# Patient Record
Sex: Male | Born: 1960 | Race: White | Hispanic: No | Marital: Married | Smoking: Former smoker
Health system: Southern US, Community
[De-identification: ages and names within clinical notes are randomized; demographics above are authoritative.]

## PROBLEM LIST (undated history)

## (undated) DIAGNOSIS — K219 Gastro-esophageal reflux disease without esophagitis: Secondary | ICD-10-CM

## (undated) DIAGNOSIS — I219 Acute myocardial infarction, unspecified: Secondary | ICD-10-CM

## (undated) DIAGNOSIS — I4891 Unspecified atrial fibrillation: Secondary | ICD-10-CM

## (undated) DIAGNOSIS — I209 Angina pectoris, unspecified: Secondary | ICD-10-CM

## (undated) DIAGNOSIS — I2510 Atherosclerotic heart disease of native coronary artery without angina pectoris: Secondary | ICD-10-CM

## (undated) DIAGNOSIS — I10 Essential (primary) hypertension: Secondary | ICD-10-CM

## (undated) HISTORY — PX: CORONARY ANGIOPLASTY: SHX604

---

## 2003-12-06 ENCOUNTER — Other Ambulatory Visit: Payer: Self-pay

## 2004-07-07 ENCOUNTER — Other Ambulatory Visit: Payer: Self-pay

## 2005-11-28 ENCOUNTER — Other Ambulatory Visit: Payer: Self-pay

## 2005-11-28 ENCOUNTER — Emergency Department: Payer: Self-pay | Admitting: Internal Medicine

## 2006-02-06 ENCOUNTER — Ambulatory Visit: Payer: Self-pay | Admitting: Gastroenterology

## 2006-03-28 ENCOUNTER — Other Ambulatory Visit: Payer: Self-pay

## 2006-03-29 ENCOUNTER — Observation Stay: Payer: Self-pay | Admitting: Internal Medicine

## 2006-09-22 ENCOUNTER — Inpatient Hospital Stay: Payer: Self-pay | Admitting: Internal Medicine

## 2006-09-22 ENCOUNTER — Other Ambulatory Visit: Payer: Self-pay

## 2006-09-23 ENCOUNTER — Other Ambulatory Visit: Payer: Self-pay

## 2007-01-16 ENCOUNTER — Inpatient Hospital Stay: Payer: Self-pay | Admitting: Internal Medicine

## 2007-01-16 ENCOUNTER — Other Ambulatory Visit: Payer: Self-pay

## 2007-01-17 ENCOUNTER — Other Ambulatory Visit: Payer: Self-pay

## 2007-01-18 ENCOUNTER — Other Ambulatory Visit: Payer: Self-pay

## 2007-01-19 ENCOUNTER — Other Ambulatory Visit: Payer: Self-pay

## 2007-02-03 ENCOUNTER — Encounter: Payer: Self-pay | Admitting: Internal Medicine

## 2007-02-13 ENCOUNTER — Encounter: Payer: Self-pay | Admitting: Internal Medicine

## 2007-03-16 ENCOUNTER — Encounter: Payer: Self-pay | Admitting: Internal Medicine

## 2007-04-15 ENCOUNTER — Encounter: Payer: Self-pay | Admitting: Internal Medicine

## 2007-05-16 ENCOUNTER — Encounter: Payer: Self-pay | Admitting: Internal Medicine

## 2007-06-15 ENCOUNTER — Encounter: Payer: Self-pay | Admitting: Internal Medicine

## 2011-08-27 ENCOUNTER — Ambulatory Visit: Payer: Self-pay

## 2014-07-01 ENCOUNTER — Inpatient Hospital Stay: Payer: Self-pay | Admitting: Internal Medicine

## 2014-07-01 LAB — BASIC METABOLIC PANEL
Anion Gap: 10 (ref 7–16)
BUN: 10 mg/dL (ref 7–18)
CHLORIDE: 106 mmol/L (ref 98–107)
CREATININE: 1.1 mg/dL (ref 0.60–1.30)
Calcium, Total: 8.5 mg/dL (ref 8.5–10.1)
Co2: 24 mmol/L (ref 21–32)
EGFR (African American): >60
EGFR (Non-African Amer.): >60
Glucose: 112 mg/dL — ABNORMAL HIGH (ref 65–99)
Osmolality: 279 (ref 275–301)
POTASSIUM: 4.2 mmol/L (ref 3.5–5.1)
Sodium: 140 mmol/L (ref 136–145)

## 2014-07-01 LAB — HEPARIN LEVEL (UNFRACTIONATED): Anti-Xa(Unfractionated): <0.1 IU/mL — ABNORMAL LOW (ref 0.30–0.70)

## 2014-07-01 LAB — CBC
HCT: 47.7 % (ref 40.0–52.0)
HGB: 16 g/dL (ref 13.0–18.0)
MCH: 31.9 pg (ref 26.0–34.0)
MCHC: 33.5 g/dL (ref 32.0–36.0)
MCV: 95 fL (ref 80–100)
Platelet: 247 10*3/uL (ref 150–440)
RBC: 5.01 10*6/uL (ref 4.40–5.90)
RDW: 13.9 % (ref 11.5–14.5)
WBC: 6.2 10*3/uL (ref 3.8–10.6)

## 2014-07-01 LAB — TROPONIN I
Troponin-I: <0.02 ng/mL
Troponin-I: <0.02 ng/mL

## 2014-07-01 LAB — PROTIME-INR
INR: 1
PROTHROMBIN TIME: 12.8 s (ref 11.5–14.7)

## 2014-07-01 LAB — APTT: Activated PTT: 31.9 secs (ref 23.6–35.9)

## 2014-07-02 LAB — CBC WITH DIFFERENTIAL/PLATELET
Basophil #: 0.1 10*3/uL (ref 0.0–0.1)
Basophil %: 1.3 %
EOS ABS: 0.5 10*3/uL (ref 0.0–0.7)
Eosinophil %: 6 %
HCT: 48.1 % (ref 40.0–52.0)
HGB: 16.4 g/dL (ref 13.0–18.0)
LYMPHS ABS: 2.2 10*3/uL (ref 1.0–3.6)
LYMPHS PCT: 24.5 %
MCH: 32.7 pg (ref 26.0–34.0)
MCHC: 34 g/dL (ref 32.0–36.0)
MCV: 96 fL (ref 80–100)
MONOS PCT: 8.4 %
Monocyte #: 0.7 x10 3/mm (ref 0.2–1.0)
NEUTROS PCT: 59.8 %
Neutrophil #: 5.3 10*3/uL (ref 1.4–6.5)
Platelet: 254 10*3/uL (ref 150–440)
RBC: 5.01 10*6/uL (ref 4.40–5.90)
RDW: 13.9 % (ref 11.5–14.5)
WBC: 8.8 10*3/uL (ref 3.8–10.6)

## 2015-01-30 ENCOUNTER — Inpatient Hospital Stay: Payer: Self-pay | Admitting: Internal Medicine

## 2015-04-07 NOTE — H&P (Signed)
PATIENT NAME:  Joseph Grant, Joseph Grant MR#:  [id number] DATE OF BIRTH:  [birth year]  DATE OF ADMISSION:  07/01/2014  PRIMARY CARE PHYSICIAN: Does not have one.   CHIEF COMPLAINT: Palpitations.   HISTORY OF PRESENT ILLNESS: This is a 54 year old male who presents to the hospital with intermittent palpitations now ongoing for the past few days. The patient has a history of paroxysmal atrial fibrillation, but has not seen a physician and lost followup in the past few years. He presented to the hospital and was noted to be in uncontrolled atrial fibrillation with heart rates in the 130s to 140s. The patient was given pulse doses of Cardizem, but despite that, his heart rate still remained uncontrolled. The patient is presently being started on a Cardizem drip. Hospitalist services were contacted for further treatment and evaluation. The patient denies any chest pain. He admits to some exertional shortness of breath, but no nausea, no vomiting or diaphoresis. No other associated symptoms presently.   REVIEW OF SYSTEMS:  CONSTITUTIONAL: No documented fever. No weight gain, no weight loss.  EYES: No blurry or double vision.  ENT: No tinnitus. No postnasal drip. No redness of the oropharynx.  RESPIRATORY: No cough, no wheeze, no hemoptysis, no dyspnea.  CARDIOVASCULAR: No chest pain. No orthopnea. Positive palpitations. No syncope.  GASTROINTESTINAL: No nausea. No vomiting. No diarrhea. No abdominal pain. No melena or hematochezia.  GENITOURINARY: No dysuria, no hematuria.  ENDOCRINE: No polyuria or nocturia, heat or cold intolerance.  HEMATOLOGIC: No anemia, no bruising, no bleeding.  INTEGUMENTARY: No rashes. No lesions.  MUSCULOSKELETAL: No arthritis, no swelling, no gout.  NEUROLOGIC: No numbness or tingling. No ataxia. No seizure-type activity. PSYCHIATRIC: No anxiety. No insomnia. No ADD.   PAST MEDICAL HISTORY: Consistent with history of hypertension, paroxysmal atrial fibrillation, coronary artery  disease.   ALLERGIES: PENICILLIN AND ZITHROMAX, BOTH OF WHICH CAUSE RASH.   SOCIAL HISTORY: Does smoke about 1/2-pack per day, has been smoking for the past 25 to 30 years. Also drinks 2 beers daily. No illicit drug abuse. Lives at home with his wife and kid.   FAMILY HISTORY: Mother and father are both alive. Father has prostate cancer. Mother also has atrial fibrillation.   CURRENT MEDICATIONS: The patient is on aspirin 81 mg daily.   PHYSICAL EXAMINATION: Presently, is as follows:  VITAL SIGNS: Noted to be: Temperature is 98.2, pulse 101, respirations 14, blood pressure 140/81, saturation is 95% on room air.  GENERAL: He is a pleasant-appearing male in no apparent distress.  HEAD, EYES, EARS, NOSE AND THROAT: Atraumatic, normocephalic. Extraocular muscles are intact. Pupils equal and reactive to light. Sclerae anicteric. No conjunctival injection. No pharyngeal erythema.  NECK: Supple. There is no jugular venous distention. No bruits, no lymphadenopathy, no thyromegaly.  HEART: Irregularly irregular. No murmurs, no rubs, no clicks.  LUNGS: He has prolonged inspiratory and expiratory phase. Negative use of accessory muscles. No dullness to percussion. No rales. No rhonchi, no wheezes.  ABDOMEN: Soft, flat, nontender, nondistended. Has good bowel sounds. No hepatosplenomegaly appreciated.  EXTREMITIES: No evidence of any cyanosis, clubbing or peripheral edema. Has +2 pedal and radial pulses bilaterally.  NEUROLOGIC: The patient is alert, awake and oriented x3 with no focal motor or sensory deficits appreciated bilaterally.  SKIN: Moist and warm with no rashes appreciated.  LYMPHATIC: There is no cervical or axillary lymphadenopathy.   LABORATORY DATA: Shows serum glucose of 112, BUN 10, creatinine 1.1, sodium 140, potassium 4.2, chloride 106, bicarbonate 24. Troponin less than 0.02. White  cell count 6.2, hemoglobin 16, hematocrit 47.7, platelet count 247.   The patient did have an EKG done  which shows uncontrolled atrial fibrillation with RVR with heart rates in the 130s to 140s.   ASSESSMENT AND PLAN: This is a 54 year old male with a history of hypertension, paroxysmal atrial fibrillation, coronary artery disease, who presents to the hospital with uncontrolled atrial fibrillation and palpitations.   1. Atrial fibrillation with rapid ventricular response. The patient has a history of paroxysmal atrial fibrillation, but has lost followup and has been asymptomatic for many years. His rate is presently significantly uncontrolled. Will start him on a Cardizem drip. Also place him on oral Cardizem and attempt to wean him off the drip. Will start him on a heparin nomogram for anticoagulation for now. His CHADS score is just 1. Will get a 2-dimensional echocardiogram. Will also get a cardiology consult. The case was discussed with Dr. Ubaldo Glassing, who will see the patient later.  2. History of coronary artery disease. The patient had no acute chest pain. I will cycle his cardiac markers, get a 2-dimensional echocardiogram.  3. Hypertension. The patient is presently hemodynamically stable. Continue his Cardizem for now.   CODE STATUS: The patient is a full code.   TIME SPENT ON ADMISSION: 50 minutes.   ____________________________ Belia Heman. Verdell Carmine, MD vjs:lb D: 07/01/2014 11:09:38 ET T: 07/01/2014 11:43:04 ET JOB#: [id number]  cc: Belia Heman. Verdell Carmine, MD, <Dictator> Henreitta Leber MD ELECTRONICALLY SIGNED 07/13/2014 14:02

## 2015-04-07 NOTE — Discharge Summary (Signed)
PATIENT NAMEKIONTE, Joseph Grant MR#:  [id number] DATE OF BIRTH:  [birth year]  DATE OF ADMISSION:  07/01/2014 DATE OF DISCHARGE:  07/02/2014  PRESENTING COMPLAINT: Palpitations.   DISCHARGE DIAGNOSES: 1.  Rapid atrial fibrillation with rapid ventricular response, now stable.  2.  Hypertension.  3.  Coronary artery disease with stent in the past.   CODE STATUS: FULL.  DISCHARGE MEDICATIONS: 1.  Eliquis 5 mg b.i.d.  2.  Cardizem CD 180 mg p.o. daily.  3.  Omeprazole 20 mg daily.   DISCHARGE INSTRUCTIONS: Follow up with Dr. Clayborn Bigness in 1 to 2 weeks.   Follow up with Dr. Randel Books in 2 weeks.   CONSULTANTS: Cardiology with Dr. Ubaldo Glassing.   DIAGNOSTIC DATA: Echo Doppler showed EF of 60% to 65%, mildly dilated left atrium, moderately increased left ventricular posterior wall thickness.   CBC within normal limits. Troponin x3 negative. Basic metabolic panel within normal limits.   BRIEF SUMMARY OF HOSPITAL COURSE: Joseph Grant is a 54 year old Caucasian gentleman with history of A-fib, paroxysmal, along with CAD in the past, who comes in with uncontrolled A-fib and palpitations. He was admitted with:  1.  A-fib with RVR. The patient has history of paroxysmal A-fib, lost to followup and has been asymptomatic. However, now started having palpitations. He received IV Cardizem, several doses, and now stable on p.o. Cardizem. Echo showed EF of 60% to 65%. Dr. Ubaldo Glassing recommends the patient be on anticoagulation with p.o. Eliquis. The patient remained hemodynamically stable.  2.  CAD status post stent in the past. Cardiac enzymes x2 were negative.  3.  Hypertension. Continue Cardizem.  4.  Smoking cessation advised.   Hospital stay otherwise remained stable. The patient remained a FULL code.   TIME SPENT: 40 minutes.   ____________________________ Hart Rochester Posey Pronto, MD sap:sb D: 07/04/2014 10:39:34 ET T: 07/04/2014 11:46:30 ET JOB#: [id number]  cc: Julie-Anne Torain A. Posey Pronto, MD, <Dictator> Dwayne D. Clayborn Bigness, MD Javier Docker Ubaldo Glassing, MD Milinda Pointer Jacqualine Code, MD  Ilda Basset MD ELECTRONICALLY SIGNED 07/11/2014 15:44

## 2015-04-07 NOTE — Consult Note (Signed)
   Present Illness 54 yo male with history of paroxysmal afib with the last episode occuring 8-10 years ago who was admitted with rapid herat rate. Noted to be in afib with rvr. Placed on iv and po cardizem and rate has improved. No evidence of chf or acute mi. Denies illicit drug use. Denies chest pain . CHADSS score is 1. No bleeding history.   Physical Exam:  GEN no acute distress   HEENT PERRL, moist oral mucosa   NECK supple   RESP clear BS   CARD Irregular rate and rhythm  No murmur   ABD denies tenderness  normal BS  hypoactive BS   LYMPH negative neck, negative axillae   EXTR negative cyanosis/clubbing, negative edema   SKIN normal to palpation   NEURO cranial nerves intact, motor/sensory function intact   PSYCH A+O to time, place, person   Review of Systems:  Subjective/Chief Complaint irregular heart rate   General: No Complaints   Skin: No Complaints   ENT: No Complaints   Eyes: No Complaints   Neck: No Complaints   Respiratory: No Complaints   Cardiovascular: Palpitations   Gastrointestinal: No Complaints   Genitourinary: No Complaints   Vascular: No Complaints   Musculoskeletal: No Complaints   Neurologic: No Complaints   Hematologic: No Complaints   Endocrine: No Complaints   Psychiatric: No Complaints   Review of Systems: All other systems were reviewed and found to be negative   Medications/Allergies Reviewed Medications/Allergies reviewed   Family & Social History:  Family and Social History:  Family History Non-Contributory   Social History negative tobacco, negative ETOH   Place of Living Home   EKG:  Interpretation afib with variable ventricular response.    Penicillin: Swelling, Rash  Zithromax: Swelling   Impression 54 yo male with history of afib in the past admitted with rapid heart rate and noted to be in recurrent afib with rvr. Rate improved with iv and po cardizem. Discussed risk and benefits of chornic  anticoagulation with patient and he reports understanding. Will discontinue heparin and place on Eliquis 5 mg bid. Will review echo when available. Ambulate and consider discharge on Eliquis 5 bid and cardizem cd 180 daily. Will consider cardiversion after 4 weeks of chornic anticoagulation as outpatient.   Plan 1. Continue with Eliquis 5 bid 2. Cardizem cd 180 daily 3 Review echo when avialable 4. Amublate and discharge if rate well controlled for consdieration for outpaitient cardioversion in 4 weeks.   Electronic Signatures: Teodoro Spray (MD)  (Signed 19-Jul-15 07:57)  Authored: General Aspect/Present Illness, History and Physical Exam, Review of System, Family & Social History, Home Medications, EKG , Allergies, Impression/Plan   Last Updated: 19-Jul-15 07:57 by Teodoro Spray (MD)

## 2015-04-15 NOTE — Consult Note (Signed)
PATIENT NAME:  Joseph Grant, Joseph Grant MR#:  [id number] DATE OF BIRTH:  February 01, 1961  DATE OF CONSULTATION:  01/30/2015  REFERRING PHYSICIAN:  Norva Riffle. Marcille Blanco, MD  CONSULTING PHYSICIAN:  Isaias Cowman, MD  CARDIOLOGIST: Loran Senters. Callwood, MD   CHIEF COMPLAINT: Rapid heart rate.   HISTORY OF PRESENT ILLNESS: The patient is a 54 year old gentleman with history of paroxysmal atrial fibrillation. He was recently seen by his cardiologist, Dr. Clayborn Bigness, who started him on Xarelto 20 mg daily. The patient reports that he was in his usual state of health until early this a.m., when he awoke with a fast heart rate. He presented to [facility], where he was noted to be in atrial fibrillation with a rapid ventricular rate. Admission laboratories were unremarkable. The patient was admitted to the CCU on Cardizem drip with control of heart rate, now in the 70s and 80s.   PAST MEDICAL HISTORY: Atrial fibrillation.   MEDICATIONS: Xarelto 20 mg daily.   SOCIAL HISTORY: The patient is married and resides with his wife. He continues to smoke.   FAMILY HISTORY: Father is status post CABG in early 55s.   REVIEW OF SYSTEMS:  CONSTITUTIONAL: No fever or chills.  EYES: No blurry vision.  EARS: No hearing loss.  RESPIRATORY: No shortness of breath.  CARDIOVASCULAR: Tachycardia as stated above.  GASTROINTESTINAL: No nausea, vomiting, or diarrhea.  GENITOURINARY: No dysuria or hematuria.  ENDOCRINE: No polyuria or polydipsia.  MUSCULOSKELETAL: No arthralgias or myalgias.  NEUROLOGICAL: No focal muscle weakness or numbness.  PSYCHOLOGICAL: No depression or anxiety.   PHYSICAL EXAMINATION: VITAL SIGNS: Blood pressure 108/72, pulse 91, respirations 15, temperature 98, pulse oximetry 96%.  HEENT: Pupils equal and reactive to light and accommodation.  NECK: Supple without thyromegaly.  LUNGS: Clear.  CARDIOVASCULAR: Normal JVP. Normal PMI. Regular rate and rhythm. Normal S1, S2. No appreciable gallop,  murmur, or rub.  ABDOMEN: Soft and nontender. Pulses were intact bilaterally.  MUSCULOSKELETAL: Normal muscle tone.  NEUROLOGIC: The patient is alert and oriented x 3. Motor and sensory both grossly intact.   IMPRESSION: A 54 year old gentleman with history of paroxysmal atrial fibrillation, who presents with atrial fibrillation with rapid ventricular rate, now controlled on Cardizem drip.   RECOMMENDATIONS: 1. Start Cardizem 60 mg q. 8. 2. Taper Cardizem drip to off 1 hour after initial oral dose of Cardizem.  3. Resume Xarelto 20 mg daily.  4. Further recommendations pending the patient's initial clinical course.    ____________________________ Isaias Cowman, MD ap:mw D: 01/30/2015 12:55:35 ET T: 01/30/2015 13:16:00 ET JOB#: [id number]  cc: Isaias Cowman, MD, <Dictator> Isaias Cowman MD ELECTRONICALLY SIGNED 02/06/2015 14:37

## 2015-04-15 NOTE — Discharge Summary (Signed)
PATIENT NAME:  Joseph Grant, PATLAN MR#:  [id number] DATE OF BIRTH:  October 24, 1961  DATE OF ADMISSION:  01/30/2015 DATE OF DISCHARGE:  01/31/2015  ADMITTING PHYSICIAN: Norva Riffle. Marcille Blanco, MD   DISCHARGING PHYSICIAN: Gladstone Lighter, MD   PRIMARY CARE PHYSICIAN: Milinda Pointer. Jacqualine Code, MD    PRIMARY CARDIOLOGIST: Dwayne D. [place], [place]: Cardiology consultation by Dr. Saralyn Pilar.  DISCHARGE DIAGNOSES:  1.  Atrial fibrillation with rapid ventricular response.  2.  Coronary artery disease, status post stent.  3.  Gastroesophageal reflex disease.   DISCHARGE HOME MEDICATIONS:  1.  Prilosec 20 mg p.o. daily.  2.  Xarelto 20 mg p.o. daily.  3.  Atorvastatin 20 mg p.o. daily.  4.  Metoprolol 25 mg p.o. b.i.d.  5.  Cardizem 180 mg p.o. daily.   DISCHARGE DIET: Low-sodium diet.   DISCHARGE ACTIVITY: As tolerated.    FOLLOWUP INSTRUCTIONS: Follow up with Dr. Clayborn Bigness in 2 weeks.   LABORATORIES AND IMAGING STUDIES PRIOR TO DISCHARGE:  1.  WBC 9.3, hemoglobin 16.4, hematocrit 47.1, platelet count 261,000.  2.  Sodium 142, potassium 3.7, chloride 108, bicarbonate 26, BUN 14, creatinine 0.96, glucose 110, and calcium of 8.3.  3.  ALT 33, AST 22, alkaline phosphatase 89, total bilirubin 0.2, albumin of 4.0.  4.  INR 0.9.  5.  Troponin is negative.  6.  Urinalysis negative for any infection.  7.  Chest x-ray showing clear lung fields. No acute cardiopulmonary disease.  8.  LDL cholesterol 104, HDL 28, triglycerides 234, total cholesterol 179, and hemoglobin A1c is 5.7.   BRIEF HOSPITAL COURSE: Mr. Joseph Grant is a 54 year old Caucasian male with past medical history significant for CAD status post stent in the past; atrial fibrillation, known history, not taking any medications up until 2 weeks ago, at which time and was started on Xarelto as an outpatient, comes to the hospital secondary to palpitations and noted to be in atrial fibrillation with RVR.  1.  Atrial fibrillation with  rapid ventricular response. Supposed to be on rate-limiting medications at home, but has not taken his Cardizem for years. Seen by Dr. Clayborn Bigness just 2 weeks ago, started on Xarelto and statin. His heart rate was in the 160s. He was started on Cardizem drip and moved to CCU. He was then placed on p.o. Cardizem and metoprolol. Heart rate is appropriately controlled and he is being discharged on both medications. He is already started on Xarelto, which will be continued. He was seen by Dr. Saralyn Pilar here in the hospital.  2.  CAD, status post stents, more than 3 years ago, appears stable on metoprolol and statin and now Xarelto at this time.   His course has been otherwise uneventful in the hospital.   DISCHARGE CONDITION: Stable.   DISCHARGE DISPOSITION: Home.   TIME SPENT ON DISCHARGE: 40 minutes.    ____________________________ Gladstone Lighter, MD rk:bm D: 01/31/2015 15:19:57 ET T: 02/01/2015 00:10:34 ET JOB#: [id number]  cc: Gladstone Lighter, MD, <Dictator> Milinda Pointer. Jacqualine Code, MD Dwayne D. Clayborn Bigness, MD Gladstone Lighter MD ELECTRONICALLY SIGNED 03/01/2015 16:56

## 2015-04-15 NOTE — H&P (Signed)
PATIENT NAME:  Joseph Grant, Joseph Grant MR#:  [id number] DATE OF BIRTH:  11-Nov-1961  DATE OF ADMISSION:  01/30/2015  REFERRING PHYSICIAN: Valli Glance. Owens Shark, MD   PRIMARY CARE PHYSICIAN: Milinda Pointer. Moffett, MD   ADMISSION DIAGNOSIS: Atrial fibrillation with rapid ventricular rate.   HISTORY OF PRESENT ILLNESS: This is a 54 year old Caucasian male who presents to the Emergency Department complaining of heart fluttering. The patient awoke from sleep feeling the palpitations but denies any chest pain or shortness of breath. He admits to feeling some dizziness as well as nausea and brief lightheadedness. At the onset of the palpitations, the patient felt some tingling in his left arm, which quickly resolved. He admits that this has happened 2 times before. In the Emergency Department, the patient received a few boluses of diltiazem, that did persistently maintain the patient's heart rate less than 120, and thus, the Emergency Department staff started a diltiazem drip, which is slowly decreasing the patient's heart rate. Due to the need for drip titration for heart rate control, the Emergency Department called for admission.   REVIEW OF SYSTEMS:  CONSTITUTIONAL: The patient denies fever or weakness.  EYES: Denies blurred vision or inflammation.  EARS, NOSE AND THROAT: Denies tinnitus or sore throat.  RESPIRATORY: Denies cough or shortness of breath.  CARDIOVASCULAR: Denies chest pain, orthopnea, paroxysmal nocturnal dyspnea, but the patient does admit to palpitations.  GASTROINTESTINAL: Denies nausea, vomiting, diarrhea, or abdominal pain.  GENITOURINARY: The patient denies dysuria, increased frequency, or hesitancy of urination.  ENDOCRINE: Denies polyuria or polydipsia.  HEMATOLOGIC AND LYMPHATIC: Denies easy bruising or bleeding.  INTEGUMENTARY: Denies rashes or lesions.  MUSCULOSKELETAL: Denies myalgias or arthralgias.  NEUROLOGIC: Denies numbness in his extremities or dysarthria.  PSYCHIATRIC: Denies  depression or suicidal ideation.   PAST MEDICAL HISTORY: Coronary artery disease status post myocardial infarction in 2008, hypertension, atrial fibrillation, and hyperlipidemia.   PAST SURGICAL HISTORY: The patient has had 1 stent placed.   SOCIAL HISTORY: He smokes 1/2 pack a day for the last 35 years. He drinks a case of beer per week and sometimes 4-5 shots of liquor on the weekends. He denies any drug use. He lives with his wife.   FAMILY HISTORY: The patient's uncle, father, and grandfather all have coronary artery disease. His father also has prostate cancer. His mother is a breast cancer survivor.   MEDICATIONS:  1.  Apixaban 5 mg 1 tablet p.o. b.i.d.  2.  Cardizem CD 180 mg per 24-hour extended-release capsule 1 tablet p.o. daily.  3.  Omeprazole 20 mg 1 tablet p.o. daily.   ALLERGIES: PENICILLIN AND ZITHROMAX.   PERTINENT LABORATORY RESULTS AND RADIOGRAPHIC FINDINGS: Serum glucose is 110, BUN 14, creatinine 0.96, serum sodium is 142, serum potassium is 3.7, chloride is 108, bicarbonate is 26, calcium is 83, serum albumin is 4, alkaline phosphatase 89, AST is 28, ALT is 33. Troponin is negative. White blood cell count is 9.3, hemoglobin is 16.4, hematocrit is 47.1, platelet count 216,000, MCV is 94. INR 0.9. Urinalysis is negative for infection. A chest x-ray shows no active disease.   PHYSICAL EXAMINATION:  VITAL SIGNS: Temperature is 98, pulse 125, respirations 13, blood pressure 125/100, pulse oximetry is 97% on room air.  GENERAL: The patient is alert and oriented x 3 in no apparent distress.  HEENT: Normocephalic, atraumatic. Pupils equal, round, and reactive to light and accommodation. Extraocular movements are intact. Mucous membranes are moist.  NECK: Trachea is midline. No adenopathy. Thyroid is nonpalpable, nontender.  CHEST:  Symmetric, atraumatic.  CARDIOVASCULAR: Tachycardic rate. Normal rhythm. Normal S1, S2. No rubs, clicks, or murmurs appreciated.  LUNGS: Clear to  auscultation bilaterally. Normal effort and excursion.  ABDOMEN: Positive bowel sounds. Soft, nontender, nondistended. No hepatosplenomegaly.  GENITOURINARY: Deferred.  MUSCULOSKELETAL: The patient moves all 4 extremities equally, has 5/5 strength in upper and lower extremities bilaterally.  SKIN: Warm and dry. There are no rashes or lesions.  EXTREMITIES: There is no clubbing, cyanosis, or edema.  NEUROLOGIC: Cranial nerves II-XII are grossly intact.  PSYCHIATRIC: Mood is normal. Affect is congruent. The patient has excellent insight and judgment into his medical condition.   ASSESSMENT AND PLAN: This is a 54 year old male admitted for atrial fibrillation with rapid ventricular response.  1.  Atrial fibrillation with rapid ventricular response: Heart rate is decreasing now that Cardizem drip has been initiated. We will titrate the drip dosage and we will continue his new oral anticoagulant. We need to clarify whether the patient is actually taking Eliquis or Xarelto.   2.  Coronary artery disease, stable: We will continue to try the patient's biomarkers.  3.  Hypertension: Continue metoprolol.  4.  Hyperlipidemia: Continue statin.  5.  Deep vein thrombosis prophylaxis: We will continue with his oral anticoagulation.  6.  Gastrointestinal prophylaxis: None, as the patient is not critically ill.   CODE STATUS: The patient is a full code.   TIME SPENT ON ADMISSION ORDERS AND PATIENT CARE: Approximately 35 minutes.    ____________________________ Norva Riffle. Marcille Blanco, MD msd:bm D: 01/30/2015 23:59:58 ET T: 01/31/2015 00:19:15 ET JOB#: [id number]  cc: Norva Riffle. Marcille Blanco, MD, <Dictator> Norva Riffle Shaquille Murdy MD ELECTRONICALLY SIGNED 02/06/2015 12:14

## 2015-09-18 ENCOUNTER — Ambulatory Visit: Payer: Self-pay | Admitting: Family Medicine

## 2015-12-21 ENCOUNTER — Ambulatory Visit
Admission: RE | Admit: 2015-12-21 | Payer: BC Managed Care – PPO | Source: Ambulatory Visit | Admitting: Gastroenterology

## 2015-12-21 ENCOUNTER — Encounter: Admission: RE | Payer: Self-pay | Source: Ambulatory Visit

## 2015-12-21 SURGERY — COLONOSCOPY WITH PROPOFOL
Anesthesia: General

## 2017-04-24 ENCOUNTER — Encounter: Payer: Self-pay | Admitting: *Deleted

## 2017-04-27 ENCOUNTER — Encounter: Payer: Self-pay | Admitting: *Deleted

## 2017-04-27 ENCOUNTER — Ambulatory Visit: Payer: BC Managed Care – PPO | Admitting: Anesthesiology

## 2017-04-27 ENCOUNTER — Ambulatory Visit
Admission: RE | Admit: 2017-04-27 | Discharge: 2017-04-27 | Disposition: A | Discharge status: Home / Self Care | Payer: BC Managed Care – PPO | Source: Ambulatory Visit | Attending: Gastroenterology | Admitting: Gastroenterology

## 2017-04-27 ENCOUNTER — Encounter
Admission: RE | Disposition: A | Discharge status: Home / Self Care | Payer: Self-pay | Source: Ambulatory Visit | Attending: Gastroenterology

## 2017-04-27 DIAGNOSIS — Z1211 Encounter for screening for malignant neoplasm of colon: Secondary | ICD-10-CM | POA: Insufficient documentation

## 2017-04-27 DIAGNOSIS — D127 Benign neoplasm of rectosigmoid junction: Secondary | ICD-10-CM | POA: Insufficient documentation

## 2017-04-27 DIAGNOSIS — Z7901 Long term (current) use of anticoagulants: Secondary | ICD-10-CM | POA: Diagnosis not present

## 2017-04-27 DIAGNOSIS — Z79899 Other long term (current) drug therapy: Secondary | ICD-10-CM | POA: Diagnosis not present

## 2017-04-27 DIAGNOSIS — I252 Old myocardial infarction: Secondary | ICD-10-CM | POA: Diagnosis not present

## 2017-04-27 DIAGNOSIS — I25119 Atherosclerotic heart disease of native coronary artery with unspecified angina pectoris: Secondary | ICD-10-CM | POA: Diagnosis not present

## 2017-04-27 DIAGNOSIS — I10 Essential (primary) hypertension: Secondary | ICD-10-CM | POA: Insufficient documentation

## 2017-04-27 DIAGNOSIS — K219 Gastro-esophageal reflux disease without esophagitis: Secondary | ICD-10-CM | POA: Insufficient documentation

## 2017-04-27 DIAGNOSIS — I4891 Unspecified atrial fibrillation: Secondary | ICD-10-CM | POA: Insufficient documentation

## 2017-04-27 DIAGNOSIS — D125 Benign neoplasm of sigmoid colon: Secondary | ICD-10-CM | POA: Insufficient documentation

## 2017-04-27 DIAGNOSIS — Z87891 Personal history of nicotine dependence: Secondary | ICD-10-CM | POA: Diagnosis not present

## 2017-04-27 DIAGNOSIS — K621 Rectal polyp: Secondary | ICD-10-CM | POA: Insufficient documentation

## 2017-04-27 DIAGNOSIS — D122 Benign neoplasm of ascending colon: Secondary | ICD-10-CM | POA: Diagnosis not present

## 2017-04-27 DIAGNOSIS — Z881 Allergy status to other antibiotic agents status: Secondary | ICD-10-CM | POA: Insufficient documentation

## 2017-04-27 DIAGNOSIS — K5730 Diverticulosis of large intestine without perforation or abscess without bleeding: Secondary | ICD-10-CM | POA: Diagnosis not present

## 2017-04-27 DIAGNOSIS — Z880 Allergy status to penicillin: Secondary | ICD-10-CM | POA: Insufficient documentation

## 2017-04-27 HISTORY — PX: COLONOSCOPY WITH PROPOFOL: SHX5780

## 2017-04-27 HISTORY — DX: Angina pectoris, unspecified: I20.9

## 2017-04-27 HISTORY — DX: Essential (primary) hypertension: I10

## 2017-04-27 HISTORY — DX: Unspecified atrial fibrillation: I48.91

## 2017-04-27 HISTORY — DX: Atherosclerotic heart disease of native coronary artery without angina pectoris: I25.10

## 2017-04-27 HISTORY — DX: Gastro-esophageal reflux disease without esophagitis: K21.9

## 2017-04-27 HISTORY — DX: Acute myocardial infarction, unspecified: I21.9

## 2017-04-27 SURGERY — COLONOSCOPY WITH PROPOFOL
Anesthesia: General

## 2017-04-27 MED ORDER — PROPOFOL 10 MG/ML IV BOLUS
INTRAVENOUS | Status: DC | PRN
  Administered 2017-04-27: 90 mg via INTRAVENOUS

## 2017-04-27 MED ORDER — SODIUM CHLORIDE 0.9 % IV SOLN
INTRAVENOUS | Status: DC
Start: 2017-04-27 — End: 2017-04-27

## 2017-04-27 MED ORDER — SODIUM CHLORIDE 0.9 % IV SOLN
INTRAVENOUS | Status: DC
  Administered 2017-04-27: 13:00:00 via INTRAVENOUS

## 2017-04-27 MED ORDER — LIDOCAINE HCL (PF) 1 % IJ SOLN: 2.0000 mL | Freq: Once | INTRAMUSCULAR | Status: DC

## 2017-04-27 MED ORDER — PROPOFOL 500 MG/50ML IV EMUL
INTRAVENOUS | Status: DC | PRN
  Administered 2017-04-27: 100 ug/kg/min via INTRAVENOUS

## 2017-04-27 NOTE — Op Note (Signed)
[facility] Patient Name: Joseph Grant Procedure Date: 04/27/2017 1:29 PM MRN: [id number] Account #: [id number] Date of Birth: 1961-06-04 Admit Type: Outpatient Age: 56 Room: [facility] ENDO ROOM 1 Gender: Male Note Status: Finalized Procedure:            Colonoscopy Indications:          Screening for colorectal malignant neoplasm, This is                        the patient's first colonoscopy Providers:            Lollie Sails, MD Referring MD:         No Local Md, MD (Referring MD) Medicines:            Monitored Anesthesia Care Complications:        No immediate complications. Procedure:            Pre-Anesthesia Assessment:                       - ASA Grade Assessment: III - A patient with severe                        systemic disease.                       After obtaining informed consent, the colonoscope was                        passed under direct vision. Throughout the procedure,                        the patient's blood pressure, pulse, and oxygen                        saturations were monitored continuously. The                        Colonoscope was introduced through the anus and                        advanced to the the cecum, identified by appendiceal                        orifice and ileocecal valve. The colonoscopy was                        performed without difficulty. The patient tolerated the                        procedure well. The quality of the bowel preparation                        was good. Findings:      Multiple small-mouthed diverticula were found in the sigmoid colon,       descending colon, transverse colon and ascending colon.      A 2 mm polyp was found in the recto-sigmoid colon. The polyp was       sessile. The polyp was removed with a cold biopsy forceps. Resection and       retrieval were complete.      A 2 mm polyp  was found in the distal sigmoid colon. The polyp was       sessile. The polyp was  removed with a cold biopsy forceps. Resection and       retrieval were complete.      A 2 mm polyp was found in the proximal ascending colon. The polyp was       sessile. The polyp was removed with a cold biopsy forceps. Resection and       retrieval were complete.      Two sessile polyps were found in the rectum. The polyps were 1 to 2 mm       in size. These polyps were removed with a cold biopsy forceps. Resection       and retrieval were complete.      Non-bleeding internal hemorrhoids were found during retroflexion and       during anoscopy. The hemorrhoids were small.      The digital rectal exam was normal. Impression:           - Diverticulosis in the sigmoid colon, in the                        descending colon, in the transverse colon and in the                        ascending colon.                       - One 2 mm polyp at the recto-sigmoid colon, removed                        with a cold biopsy forceps. Resected and retrieved.                       - One 2 mm polyp in the distal sigmoid colon, removed                        with a cold biopsy forceps. Resected and retrieved.                       - One 2 mm polyp in the proximal ascending colon,                        removed with a cold biopsy forceps. Resected and                        retrieved.                       - Two 1 to 2 mm polyps in the rectum, removed with a                        cold biopsy forceps. Resected and retrieved.                       - Non-bleeding internal hemorrhoids. Recommendation:       - Discharge patient to home.                       - Telephone GI clinic for pathology results in 1 week.                       -  Await pathology results. Procedure Code(s):    --- Professional ---                       [phone], Colonoscopy, flexible; with biopsy, single or                        multiple Diagnosis Code(s):    --- Professional ---                       Z12.11, Encounter for screening for  malignant neoplasm                        of colon                       K64.8, Other hemorrhoids                       D12.7, Benign neoplasm of rectosigmoid junction                       D12.5, Benign neoplasm of sigmoid colon                       D12.2, Benign neoplasm of ascending colon                       K62.1, Rectal polyp                       K57.30, Diverticulosis of large intestine without                        perforation or abscess without bleeding CPT copyright 2016 American Medical Association. All rights reserved. The codes documented in this report are preliminary and upon coder review may  be revised to meet current compliance requirements. Lollie Sails, MD 04/27/2017 2:02:42 PM This report has been signed electronically. Number of Addenda: 0 Note Initiated On: 04/27/2017 1:29 PM Scope Withdrawal Time: 0 hours 8 minutes 41 seconds  Total Procedure Duration: 0 hours 22 minutes 29 seconds       [facility]

## 2017-04-27 NOTE — Anesthesia Post-op Follow-up Note (Cosign Needed)
Anesthesia QCDR form completed.        

## 2017-04-27 NOTE — Anesthesia Postprocedure Evaluation (Signed)
Anesthesia Post Note  Patient: Joseph Grant  Procedure(s) Performed: Procedure(s) (LRB): COLONOSCOPY WITH PROPOFOL (N/A)  Patient location during evaluation: Endoscopy Anesthesia Type: General Level of consciousness: awake and alert Pain management: pain level controlled Vital Signs Assessment: post-procedure vital signs reviewed and stable Respiratory status: spontaneous breathing and respiratory function stable Cardiovascular status: stable Anesthetic complications: no     Last Vitals:  Vitals:   04/27/17 1425 04/27/17 1435  BP: 126/87 127/82  Pulse: (!) 57 (!) 55  Resp: 16 16  Temp:      Last Pain:  Vitals:   04/27/17 1405  TempSrc: Tympanic                 KEPHART,WILLIAM K

## 2017-04-27 NOTE — H&P (Signed)
Outpatient short stay form Pre-procedure 04/27/2017 1:20 PM Lollie Sails MD  Primary Physician: Dr Sherrin Daisy  Reason for visit:  Screening colonoscopy  History of present illness:  Patient is a 56 year old male presenting today for his initial colonoscopy. He tolerated his prep well. He takes no aspirin products. He does take Xarelto but that has been held for over 2-3 days. He does have a history of atrial fibrillation as well as MI.    Current Facility-Administered Medications:  .  0.9 %  sodium chloride infusion, , Intravenous, Continuous, Lollie Sails, MD, Last Rate: 20 mL/hr at 04/27/17 1257 .  0.9 %  sodium chloride infusion, , Intravenous, Continuous, Lollie Sails, MD .  lidocaine (PF) (XYLOCAINE) 1 % injection 2 mL, 2 mL, Intradermal, Once, Lollie Sails, MD  Prescriptions Prior to Admission  Medication Sig Dispense Refill Last Dose  . hydrochlorothiazide (HYDRODIURIL) 12.5 MG tablet Take 12.5 mg by mouth daily.   04/27/2017 at 0830  . lovastatin (MEVACOR) 20 MG tablet Take 20 mg by mouth at bedtime.     Marland Kitchen omeprazole (PRILOSEC) 20 MG capsule Take 20 mg by mouth daily.     . rivaroxaban (XARELTO) 20 MG TABS tablet Take 20 mg by mouth daily with supper.   04/19/2017  . sotalol (BETAPACE) 80 MG tablet Take 80 mg by mouth 2 (two) times daily.   04/27/2017 at 0830     Allergies  Allergen Reactions  . Clindamycin/Lincomycin   . Erythromycin   . Penicillins      Past Medical History:  Diagnosis Date  . AF (atrial fibrillation) ([place])   . Anginal pain ([place])   . Coronary artery disease   . GERD (gastroesophageal reflux disease)   . Hypertension   . Myocardial infarction [facility])     Review of systems:      Physical Exam    Heart and lungs: Regular rate and rhythm without rub or gallop lungs are bilaterally clear    HEENT: Normocephalic atraumatic eyes are anicteric    Other:     Pertinant exam for procedure: Soft nontender nondistended bowel  sounds positive normoactive.    Planned proceedures: Colonoscopy and indicated procedures. I have discussed the risks benefits and complications of procedures to include not limited to bleeding, infection, perforation and the risk of sedation and the patient wishes to proceed.    Lollie Sails, MD Gastroenterology 04/27/2017  1:20 PM

## 2017-04-27 NOTE — Anesthesia Preprocedure Evaluation (Signed)
Anesthesia Evaluation  Patient identified by MRN, date of birth, ID band Patient awake    Reviewed: Allergy & Precautions, NPO status , Patient's Chart, lab work & pertinent test results  History of Anesthesia Complications Negative for: history of anesthetic complications  Airway Mallampati: II       Dental   Pulmonary neg pulmonary ROS, former smoker,           Cardiovascular hypertension, Pt. on medications + angina + CAD and + Past MI  + dysrhythmias Atrial Fibrillation      Neuro/Psych negative neurological ROS     GI/Hepatic Neg liver ROS, GERD  Medicated and Controlled,  Endo/Other  negative endocrine ROS  Renal/GU negative Renal ROS     Musculoskeletal   Abdominal   Peds  Hematology   Anesthesia Other Findings   Reproductive/Obstetrics                             Anesthesia Physical Anesthesia Plan  ASA: III  Anesthesia Plan: General   Post-op Pain Management:    Induction: Intravenous  Airway Management Planned: Nasal Cannula  Additional Equipment:   Intra-op Plan:   Post-operative Plan:   Informed Consent: I have reviewed the patients History and Physical, chart, labs and discussed the procedure including the risks, benefits and alternatives for the proposed anesthesia with the patient or authorized representative who has indicated his/her understanding and acceptance.     Plan Discussed with:   Anesthesia Plan Comments:         Anesthesia Quick Evaluation

## 2017-04-27 NOTE — Transfer of Care (Signed)
Immediate Anesthesia Transfer of Care Note  Patient: Joseph Grant  Procedure(s) Performed: Procedure(s): COLONOSCOPY WITH PROPOFOL (N/A)  Patient Location: PACU and Endoscopy Unit  Anesthesia Type:General  Level of Consciousness: drowsy and patient cooperative  Airway & Oxygen Therapy: Patient Spontanous Breathing and Patient connected to nasal cannula oxygen  Post-op Assessment: Report given to RN and Post -op Vital signs reviewed and stable  Post vital signs: Reviewed and stable  Last Vitals:  Vitals:   04/27/17 1242 04/27/17 1407  BP: (!) 141/95 138/62  Pulse: 67 62  Resp: 17   Temp: 36.4 C     Last Pain:  Vitals:   04/27/17 1242  TempSrc: Tympanic         Complications: No apparent anesthesia complications

## 2017-04-28 ENCOUNTER — Encounter: Payer: Self-pay | Admitting: Gastroenterology

## 2017-04-29 LAB — SURGICAL PATHOLOGY

## 2019-11-14 ENCOUNTER — Emergency Department
Admission: EM | Admit: 2019-11-14 | Discharge: 2019-11-14 | Disposition: A | Discharge status: Home / Self Care | Payer: BC Managed Care – PPO | Attending: Emergency Medicine | Admitting: Emergency Medicine

## 2019-11-14 ENCOUNTER — Encounter: Payer: Self-pay | Admitting: Emergency Medicine

## 2019-11-14 ENCOUNTER — Other Ambulatory Visit: Payer: Self-pay

## 2019-11-14 DIAGNOSIS — Z79899 Other long term (current) drug therapy: Secondary | ICD-10-CM | POA: Insufficient documentation

## 2019-11-14 DIAGNOSIS — F17210 Nicotine dependence, cigarettes, uncomplicated: Secondary | ICD-10-CM | POA: Insufficient documentation

## 2019-11-14 DIAGNOSIS — I10 Essential (primary) hypertension: Secondary | ICD-10-CM | POA: Diagnosis not present

## 2019-11-14 DIAGNOSIS — R030 Elevated blood-pressure reading, without diagnosis of hypertension: Secondary | ICD-10-CM

## 2019-11-14 NOTE — Discharge Instructions (Addendum)
Continue having your blood pressure monitored.  If it continues to stay elevated you may want to make an appointment with Dr. Sabra Heck prior to your next visit for evaluation.  Continue taking your hydrochlorothiazide 12.5 daily.  Monitor your salt intake as this may be a cause of the elevation.  Continue decreasing your smoking as you are doing a great job.

## 2019-11-14 NOTE — ED Provider Notes (Signed)
[facility] Emergency Department Provider Note  ____________________________________________   First MD Initiated Contact with Patient 11/14/19 1037     (approximate)  I have reviewed the triage vital signs and the nursing notes.   HISTORY  Chief Complaint Hypertension   HPI Joseph Grant is a 58 y.o. male presents to the ED with elevated blood pressure.  [facility] took his blood pressure and it was 194/114.  Patient denies any symptoms and states that he takes his blood pressure medication daily.  He denies any headache, vision changes, shortness of breath, chest pain or dizziness.  Patient states he was there for smoking cessation unrelated to hypertension.  He denies any pain at this time.     Past Medical History:  Diagnosis Date  . AF (atrial fibrillation) ([place])   . Anginal pain ([place])   . Coronary artery disease   . GERD (gastroesophageal reflux disease)   . Hypertension   . Myocardial infarction ([place])     There are no active problems to display for this patient.   Past Surgical History:  Procedure Laterality Date  . COLONOSCOPY WITH PROPOFOL N/A 04/27/2017   Procedure: COLONOSCOPY WITH PROPOFOL;  Surgeon: Lollie Sails, MD;  Location: [facility] ENDOSCOPY;  Service: Endoscopy;  Laterality: N/A;  . CORONARY ANGIOPLASTY      Prior to Admission medications   Medication Sig Start Date End Date Taking? Authorizing Provider  hydrochlorothiazide (HYDRODIURIL) 12.5 MG tablet Take 12.5 mg by mouth daily.    [provider]  lovastatin (MEVACOR) 20 MG tablet Take 20 mg by mouth at bedtime.    [provider]  omeprazole (PRILOSEC) 20 MG capsule Take 20 mg by mouth daily.    [provider]  rivaroxaban (XARELTO) 20 MG TABS tablet Take 20 mg by mouth daily with supper.    [provider]  sotalol (BETAPACE) 80 MG tablet Take 80 mg by mouth 2 (two) times daily.     [provider]    Allergies Clindamycin/lincomycin, Erythromycin, and Penicillins  No family history on file.  Social History Social History   Tobacco Use  . Smoking status: Current Every Day Smoker  . Smokeless tobacco: Former Systems developer  . Tobacco comment: 3-4 per day  Substance Use Topics  . Alcohol use: Yes  . Drug use: Not on file    Review of Systems Constitutional: No fever/chills Cardiovascular: Denies chest pain. Respiratory: Denies shortness of breath. Gastrointestinal: No abdominal pain.  No nausea, no vomiting.  Musculoskeletal: Negative for muscle skeletal pain. Skin: Negative for rash. Neurological: Negative for headaches, focal weakness or numbness. ____________________________________________   PHYSICAL EXAM:  VITAL SIGNS: ED Triage Vitals [11/14/19 1022]  Enc Vitals Group     BP (!) 166/99     Pulse Rate 65     Resp 14     Temp 98.7 F (37.1 C)     Temp Source Oral     SpO2 96 %     Weight 245 lb (111.1 kg)     Height 6\' 2"  (1.88 m)     Head Circumference      Peak Flow      Pain Score 0     Pain Loc      Pain Edu?      Excl. in [place]?    Constitutional: Alert and oriented. Well appearing and in no acute distress. Eyes: Conjunctivae are normal.  Head: Atraumatic.  Neck: No stridor.   Cardiovascular: Normal rate, regular rhythm. Grossly normal heart sounds.  Good peripheral circulation. Respiratory: Normal respiratory effort.  No retractions. Lungs CTAB. Musculoskeletal: Moves upper and lower extremities no difficulty.  Normal gait was noted. Neurologic:  Normal speech and language. No gross focal neurologic deficits are appreciated. No gait instability. Skin:  Skin is warm, dry and intact. No rash noted. Psychiatric: Mood and affect are normal. Speech and behavior are normal.  ____________________________________________   LABS (all labs ordered are listed, but only abnormal results are displayed)  Labs Reviewed - No data to display   PROCEDURES  Procedure(s) performed (including Critical Care):  Procedures ____________________________________________   INITIAL IMPRESSION / ASSESSMENT AND PLAN / ED COURSE  As part of my medical decision making, I reviewed the following data within the electronic MEDICAL RECORD NUMBER Notes from prior ED visits and [place] Controlled Substance Database  58 year old male with history of hypertension [facility] after his initial blood pressure there was elevated.  Patient states that he continues to take his blood pressure medication and denies any symptoms of headache, chest pain, shortness of breath or dizziness.  [facility] unrelated to hypertension.  Patient will follow up with his PCP if any continued problems or concerns. ____________________________________________   FINAL CLINICAL IMPRESSION(S) / ED DIAGNOSES  Final diagnoses:  Elevated blood pressure reading  Hypertension, unspecified type     ED Discharge Orders    None       Note:  This document was prepared using Dragon voice recognition software and may include unintentional dictation errors.    Johnn Hai, PA-C 11/14/19 1431    Vanessa [place], MD 11/18/19 [phone]

## 2019-11-14 NOTE — ED Triage Notes (Signed)
194/114 bp [facility].  He is asymptomatic and is on bp med already.  They sent him here.

## 2020-12-16 ENCOUNTER — Other Ambulatory Visit: Payer: Self-pay

## 2020-12-16 DIAGNOSIS — F17200 Nicotine dependence, unspecified, uncomplicated: Secondary | ICD-10-CM | POA: Insufficient documentation

## 2020-12-16 DIAGNOSIS — R519 Headache, unspecified: Secondary | ICD-10-CM | POA: Diagnosis present

## 2020-12-16 DIAGNOSIS — I2510 Atherosclerotic heart disease of native coronary artery without angina pectoris: Secondary | ICD-10-CM | POA: Diagnosis not present

## 2020-12-16 DIAGNOSIS — I10 Essential (primary) hypertension: Secondary | ICD-10-CM | POA: Insufficient documentation

## 2020-12-16 DIAGNOSIS — Z7901 Long term (current) use of anticoagulants: Secondary | ICD-10-CM | POA: Diagnosis not present

## 2020-12-16 DIAGNOSIS — I4891 Unspecified atrial fibrillation: Secondary | ICD-10-CM | POA: Insufficient documentation

## 2020-12-16 DIAGNOSIS — Z79899 Other long term (current) drug therapy: Secondary | ICD-10-CM | POA: Diagnosis not present

## 2020-12-16 LAB — BASIC METABOLIC PANEL
Anion gap: 11 (ref 5–15)
BUN: 14 mg/dL (ref 6–20)
CO2: 24 mmol/L (ref 22–32)
Calcium: 9.1 mg/dL (ref 8.9–10.3)
Chloride: 102 mmol/L (ref 98–111)
Creatinine, Ser: 0.94 mg/dL (ref 0.61–1.24)
GFR, Estimated: >60 mL/min (ref >60)
Glucose, Bld: 114 mg/dL — ABNORMAL HIGH (ref 70–99)
Potassium: 3.8 mmol/L (ref 3.5–5.1)
Sodium: 137 mmol/L (ref 135–145)

## 2020-12-16 LAB — CBC
HCT: 46.6 % (ref 39.0–52.0)
Hemoglobin: 16.1 g/dL (ref 13.0–17.0)
MCH: 32.5 pg (ref 26.0–34.0)
MCHC: 34.5 g/dL (ref 30.0–36.0)
MCV: 94 fL (ref 80.0–100.0)
Platelets: 311 10*3/uL (ref 150–400)
RBC: 4.96 MIL/uL (ref 4.22–5.81)
RDW: 13.7 % (ref 11.5–15.5)
WBC: 10.8 10*3/uL — ABNORMAL HIGH (ref 4.0–10.5)
nRBC: 0 % (ref 0.0–0.2)

## 2020-12-16 NOTE — ED Triage Notes (Signed)
Reports hypertension when checking BP at home 217/119 at home. Sx of headache, dizziness and nausea at home at [facility] today.  Denies CP or SOB Takes BP medication at home.  Pt alert and oriented X4, cooperative, RR even and unlabored, color WNL. Pt in NAD.

## 2020-12-17 ENCOUNTER — Emergency Department
Admission: EM | Admit: 2020-12-17 | Discharge: 2020-12-17 | Disposition: A | Discharge status: Home / Self Care | Payer: BC Managed Care – PPO | Attending: Emergency Medicine | Admitting: Emergency Medicine

## 2020-12-17 ENCOUNTER — Emergency Department: Payer: BC Managed Care – PPO

## 2020-12-17 DIAGNOSIS — I10 Essential (primary) hypertension: Secondary | ICD-10-CM

## 2020-12-17 DIAGNOSIS — R519 Headache, unspecified: Secondary | ICD-10-CM

## 2020-12-17 LAB — TROPONIN I (HIGH SENSITIVITY): Troponin I (High Sensitivity): 10 ng/L (ref <18)

## 2020-12-17 MED ORDER — SOTALOL HCL 80 MG PO TABS
80.0000 mg | ORAL_TABLET | Freq: Once | ORAL | Status: AC
  Administered 2020-12-17: 80 mg via ORAL
  Filled 2020-12-17: qty 1

## 2020-12-17 MED ORDER — ACETAMINOPHEN 500 MG PO TABS
1000.0000 mg | ORAL_TABLET | Freq: Once | ORAL | Status: AC
  Administered 2020-12-17: 1000 mg via ORAL
  Filled 2020-12-17: qty 2

## 2020-12-17 MED ORDER — HYDROCHLOROTHIAZIDE 25 MG PO TABS: 25.0000 mg | ORAL_TABLET | Freq: Every day | ORAL | Status: DC

## 2020-12-17 NOTE — Discharge Instructions (Signed)
Use Tylenol for pain and fevers.  Up to 1000 mg per dose, up to 4 times per day.  Do not take more than 4000 mg of Tylenol/acetaminophen within 24 hours..  As we discussed, please increase your hydrochlorothiazide/HCTZ to 25 mg per dose.  This will be 2 pills per dose, once daily.  Continue all of your other prescription medications as prescribed, including her Xarelto and sotalol.  Try to check your blood pressure a few times per week when you are feeling normal, write these numbers down, and bring them to your PCP to discuss your blood pressure regimen.  I have attached the phone number for a local GI physician, please call them to be seen in the clinic in the next couple weeks to discuss repeat endoscopy to look for stomach ulcers. If you have continued bloody stools recurrently, please return to the ED considering you are on Xarelto.

## 2020-12-17 NOTE — ED Provider Notes (Signed)
[facility] Emergency Department Provider Note ____________________________________________   Event Date/Time   First MD Initiated Contact with Patient 12/17/20 [phone]     (approximate)  I have reviewed the triage vital signs and the nursing notes.  HISTORY  Chief Complaint Hypertension   HPI Joseph Grant is a 60 y.o. malewho presents to the ED for evaluation of hypertension.  Chart review indicates history of A. fib on Xarelto.  HTN on HCTZ 12.5 daily and sotalol 80 mg twice daily.  GERD and HLD.  Patient reports being in his typical state of health until developing global aching headache and presyncopal lightheaded dizziness yesterday.  He reports checking his blood pressure due to this, and noting multiple hypertensive readings.  After continued symptoms despite extra dosing of HCTZ, and noting systolic BP greater than 200, he presents to the ED for evaluation.  Patient continues to report a mild headache as his only symptom here.  4/10 intensity, aching bitemporal headache.  Denies any syncopal episodes, falls or trauma, chest pain, emesis, diaphoresis, vertiginous dizziness, or abdominal pain.  Past Medical History:  Diagnosis Date  . AF (atrial fibrillation) (HCC)   . Anginal pain (HCC)   . Coronary artery disease   . GERD (gastroesophageal reflux disease)   . Hypertension   . Myocardial infarction (HCC)     There are no problems to display for this patient.   Past Surgical History:  Procedure Laterality Date  . COLONOSCOPY WITH PROPOFOL N/A 04/27/2017   Procedure: COLONOSCOPY WITH PROPOFOL;  Surgeon: Christena DeemSkulskie, Martin U, MD;  [facility] END[facility];  Service: Endoscopy;  Laterality: N/A;  . CORONARY ANGIOPLASTY      Prior to Admission medications   Medication Sig Start Date End Date Taking? Authorizing Provider  hydrochlorothiazide (HYDRODIURIL) 12.5 MG tablet Take 12.5 mg by mouth daily.    [provider]  lovastatin (MEVACOR)  20 MG tablet Take 20 mg by mouth at bedtime.    [provider]  omeprazole (PRILOSEC) 20 MG capsule Take 20 mg by mouth daily.    [provider]  rivaroxaban (XARELTO) 20 MG TABS tablet Take 20 mg by mouth daily with supper.    [provider]  sotalol (BETAPACE) 80 MG tablet Take 80 mg by mouth 2 (two) times daily.    [provider]    Allergies Clindamycin/lincomycin, Erythromycin, and Penicillins  No family history on file.  Social History Social History   Tobacco Use  . Smoking status: Current Every Day Smoker  . Smokeless tobacco: Former NeurosurgeonUser  . Tobacco comment: 3-4 per day  Substance Use Topics  . Alcohol use: Yes    Review of Systems  Constitutional: No fever/chills.  Positive for generalized weakness Eyes: No visual changes. ENT: No sore throat. Cardiovascular: Denies chest pain. Respiratory: Denies shortness of breath. Gastrointestinal: No abdominal pain.  No nausea, no vomiting.  No diarrhea.  No constipation. Genitourinary: Negative for dysuria. Musculoskeletal: Negative for back pain. Skin: Negative for rash. Neurological: Negative for focal weakness or numbness.  Positive for headache  ____________________________________________   PHYSICAL EXAM:  VITAL SIGNS: Vitals:   12/17/20 0238 12/17/20 0641  BP: (!) 162/99 (!) 179/92  Pulse: 75 (!) 58  Resp: 18   Temp:    SpO2: 97% 98%     Constitutional: Alert and oriented. Well appearing and in no acute distress. Eyes: Conjunctivae are normal. PERRL. EOMI. Head: Atraumatic. Nose: No congestion/rhinnorhea. Mouth/Throat: Mucous membranes are moist.  Oropharynx non-erythematous. Neck: No  stridor. No cervical spine tenderness to palpation. Cardiovascular: Normal rate, regular rhythm. Grossly normal heart sounds.  Good peripheral circulation. Respiratory: Normal respiratory effort.  No retractions. Lungs CTAB. Gastrointestinal: Soft , nondistended, nontender to  palpation. No CVA tenderness. Musculoskeletal: No lower extremity tenderness nor edema.  No joint effusions. No signs of acute trauma. Neurologic:  Normal speech and language. No gross focal neurologic deficits are appreciated. No gait instability noted. Cranial nerves II through XII intact 5/5 strength and sensation in all 4 extremities Skin:  Skin is warm, dry and intact. No rash noted. Psychiatric: Mood and affect are normal. Speech and behavior are normal.  ____________________________________________   LABS (all labs ordered are listed, but only abnormal results are displayed)  Labs Reviewed  BASIC METABOLIC PANEL - Abnormal; Notable for the following components:      Result Value   Glucose, Bld 114 (*)    All other components within normal limits  CBC - Abnormal; Notable for the following components:   WBC 10.8 (*)    All other components within normal limits  URINALYSIS, COMPLETE (UACMP) WITH MICROSCOPIC  TROPONIN I (HIGH SENSITIVITY)   ____________________________________________  12 Lead EKG  Sinus rhythm, rate of 62 bpm.  Normal axis.  Normal intervals.  No evidence of acute ischemia. ____________________________________________  RADIOLOGY  ED MD interpretation: CT head reviewed by me without evidence of acute intracranial pathology.  Official radiology report(s): CT Head Wo Contrast  Result Date: 12/17/2020 CLINICAL DATA:  Headache. EXAM: CT HEAD WITHOUT CONTRAST TECHNIQUE: Contiguous axial images were obtained from the base of the skull through the vertex without intravenous contrast. COMPARISON:  11/28/2005. FINDINGS: Brain: There is no evidence for acute hemorrhage, hydrocephalus, mass lesion, or abnormal extra-axial fluid collection. No definite CT evidence for acute infarction. Diffuse loss of parenchymal volume is consistent with atrophy. Vascular: No hyperdense vessel or unexpected calcification. Skull: No evidence for fracture. No worrisome lytic or sclerotic  lesion. Sinuses/Orbits: The visualized paranasal sinuses and mastoid air cells are clear. Visualized portions of the globes and intraorbital fat are unremarkable. Other: None. IMPRESSION: 1. No acute intracranial abnormality. 2. Mild atrophy. Electronically Signed   By: Misty Stanley M.D.   On: 12/17/2020 06:34    ____________________________________________   PROCEDURES and INTERVENTIONS  Procedure(s) performed (including Critical Care):  .1-3 Lead EKG Interpretation Performed by: Vladimir Crofts, MD Authorized by: Vladimir Crofts, MD     Interpretation: normal     ECG rate:  62   ECG rate assessment: normal     Rhythm: sinus rhythm     Ectopy: none     Conduction: normal      Medications  sotalol (BETAPACE) tablet 80 mg (has no administration in time range)  hydrochlorothiazide (HYDRODIURIL) tablet 25 mg (has no administration in time range)  acetaminophen (TYLENOL) tablet 1,000 mg (1,000 mg Oral Given 12/17/20 [id number])    ____________________________________________   MDM / ED COURSE   60 year old male with history of HTN and A. fib on Xarelto presents to the ED with vague dizziness and headache in the setting of home hypertension, resolving with BP control, and amenable to outpatient management.  Hypertensive with systolics ranging [id number], otherwise normal vitals on room air.  Exam reassuring without evidence of acute derangements.  No neurovascular deficits, distress or signs of trauma.  Work-up without evidence of endorgan damage or intracranial hemorrhage.  We discussed increasing his HCTZ from 12.5-25 mg per dose, and following up with his PCP to discuss his antihypertensive regimen.  We  discussed return precautions for the ED. Just prior to discharge, patient did let me know about a single episode of melena that occurred earlier today.  We discussed following up with GI as an outpatient for endoscopy.  We discussed return precautions for the ED related to this as well.  He denies  abdominal pain.    Clinical Course as of 12/17/20 0705  Mon Dec 17, 2020  0703 Reassessed.  Patient reports improving headache after Tylenol.  We discussed reassuring CT head.  Wife now the bedside.  We discussed increasing his HCTZ up to 25 mg per dose.  We discussed blood pressure log as an outpatient following up with his PCP.  We discussed return precautions for the ED. [DS]    Clinical Course User Index [DS] Delton Prairie, MD    ____________________________________________   FINAL CLINICAL IMPRESSION(S) / ED DIAGNOSES  Final diagnoses:  Primary hypertension  Acute nonintractable headache, unspecified headache type     ED Discharge Orders    None       Ky Rumple   Note:  This document was prepared using Dragon voice recognition software and may include unintentional dictation errors.   Delton Prairie, MD 12/17/20 [phone]

## 2020-12-17 NOTE — ED Notes (Signed)
Pt states he began to feel dizzy last night and had a headache. Pt states he checked his blood pressure and it was very elevated.

## 2020-12-17 NOTE — ED Notes (Signed)
Report to [employer], rn.

## 2021-12-04 IMAGING — CT CT HEAD W/O CM
3 series · 16 of 47 positions shown, 19 images · non-contrast
Comparison: 11/28/2005.

CLINICAL DATA: Headache.

EXAM:
CT HEAD WITHOUT CONTRAST
TECHNIQUE: Contiguous axial images were obtained from the base of the skull
through the vertex without intravenous contrast.

[Series 3: head wo · axial · 0.43mm/px · z∈[-99,+36]mm · 10 of 33 slices shown, 13 images]
[im 3/33  brain]
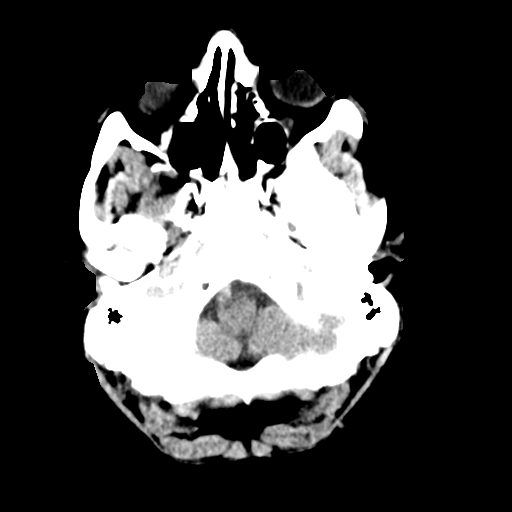
[im 3/33  bone]
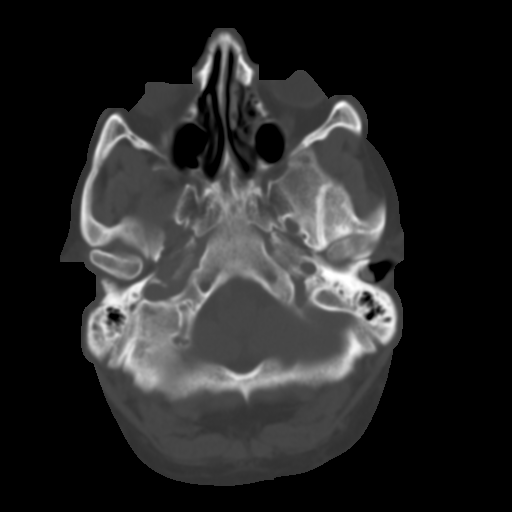
[im 6/33  brain]
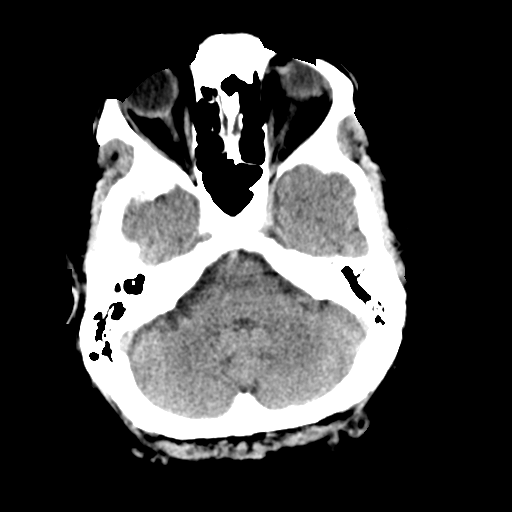
[im 9/33  brain]
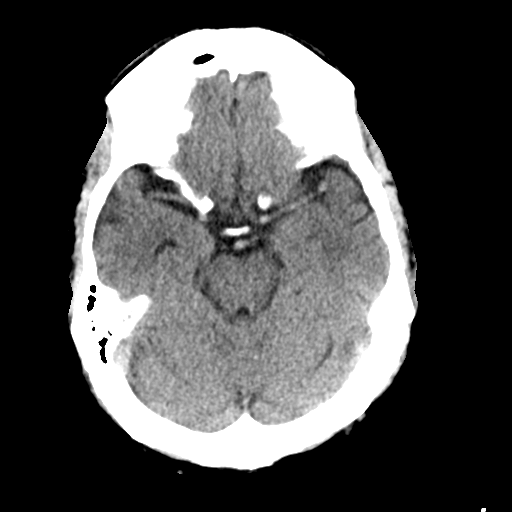
[im 12/33  brain]
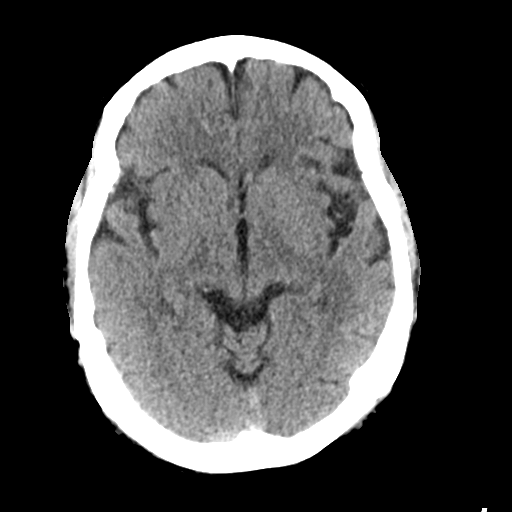
[im 15/33  brain]
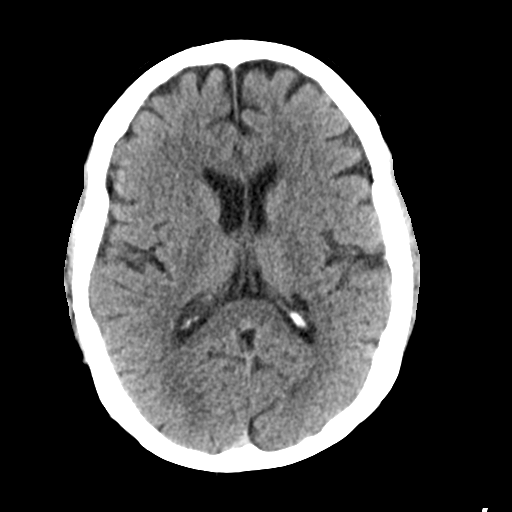
[im 15/33  bone]
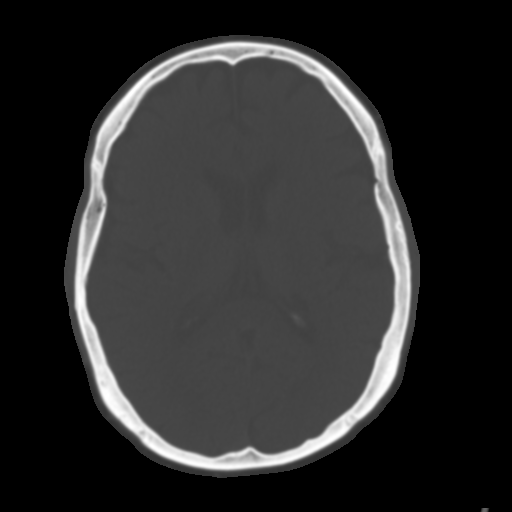
[im 18/33  brain]
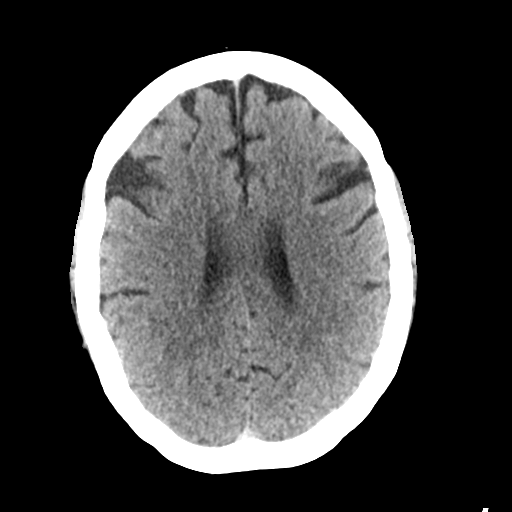
[im 21/33  brain]
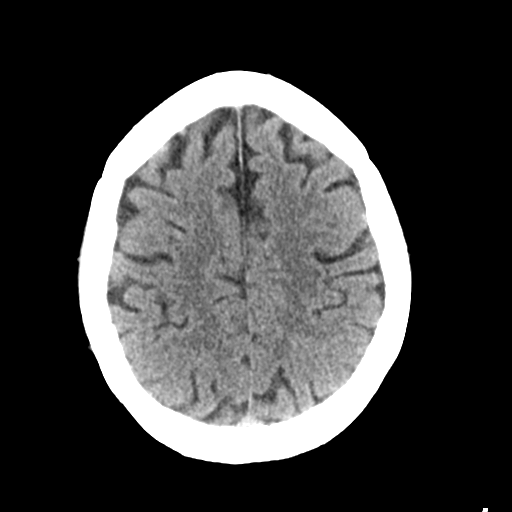
[im 25/33  brain]
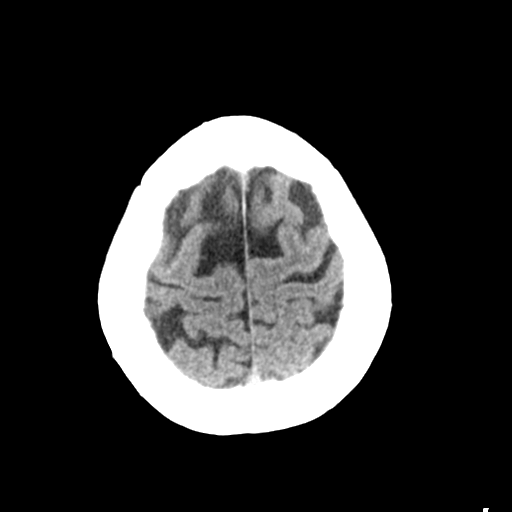
[im 27/33  brain]
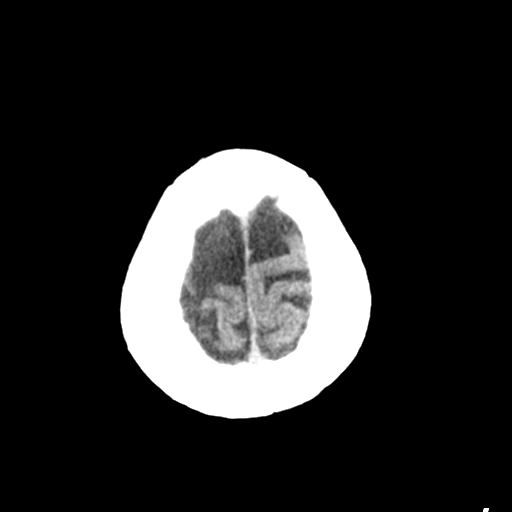
[im 27/33  bone]
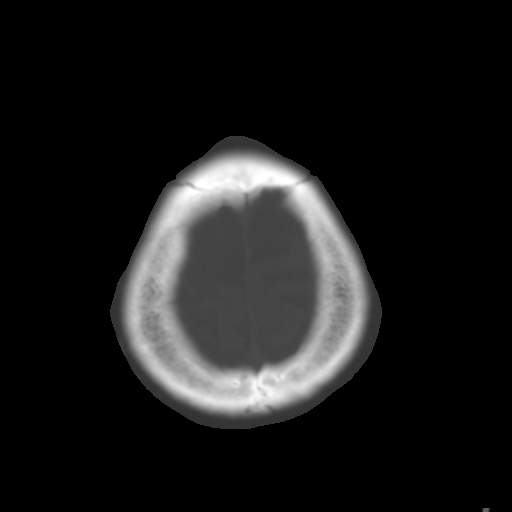
[im 30/33  brain]
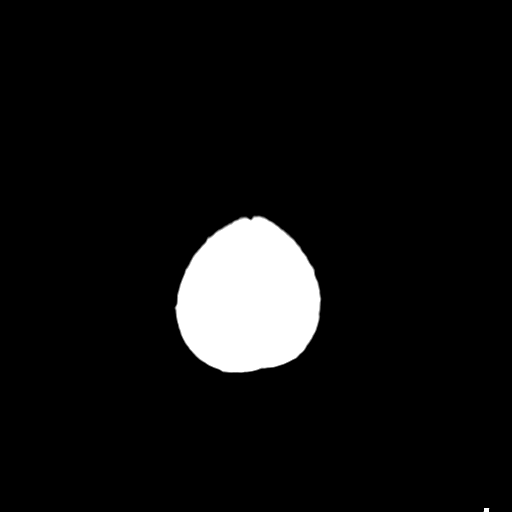

[Series 4: coronal soft tissue · coronal · 0.31mm/px · 3 of 67 slices shown]
[im 23/67  brain]
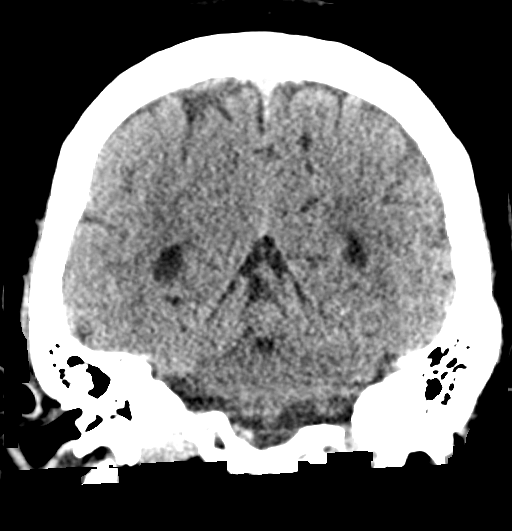
[im 30/67  brain]
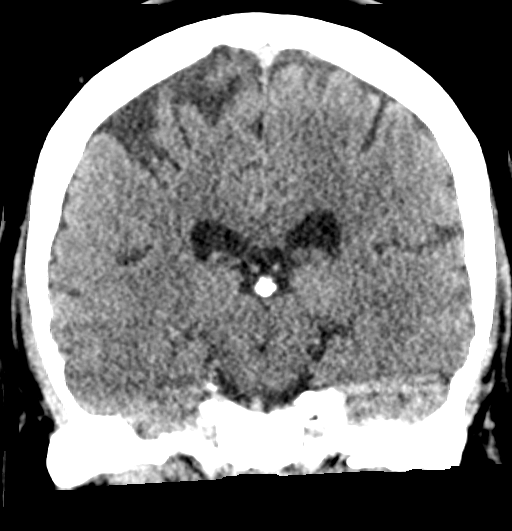
[im 37/67  brain]
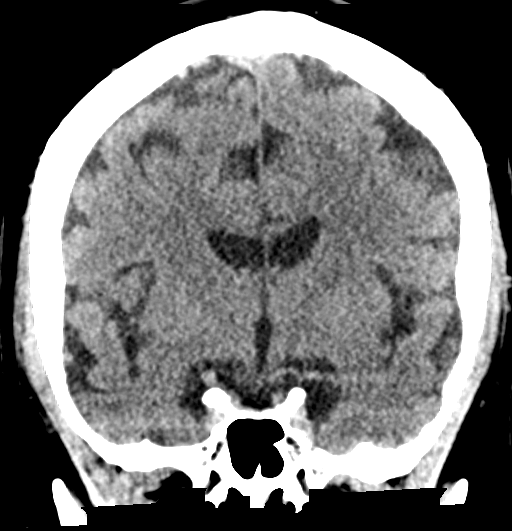

[Series 5: sagittal soft tissue · sagittal · 0.33mm/px · 3 of 54 slices shown]
[im 18/54  brain]
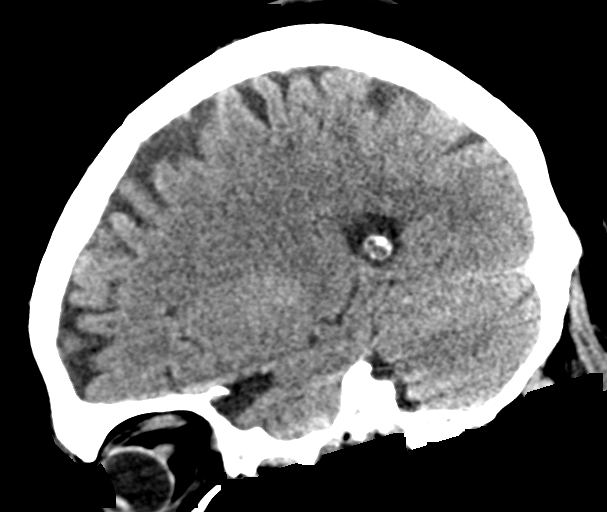
[im 27/54  brain]
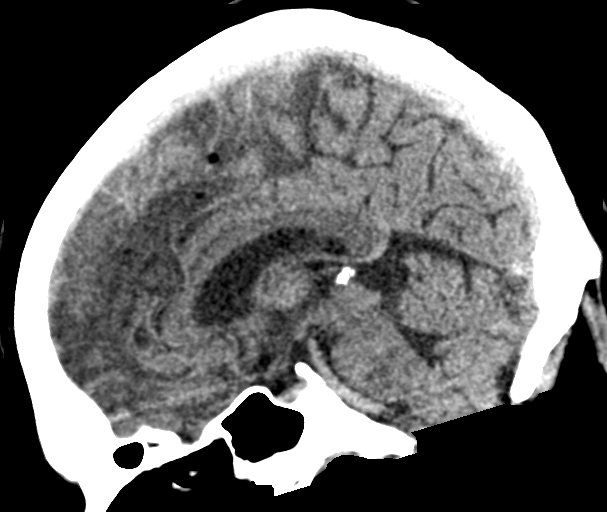
[im 36/54  brain]
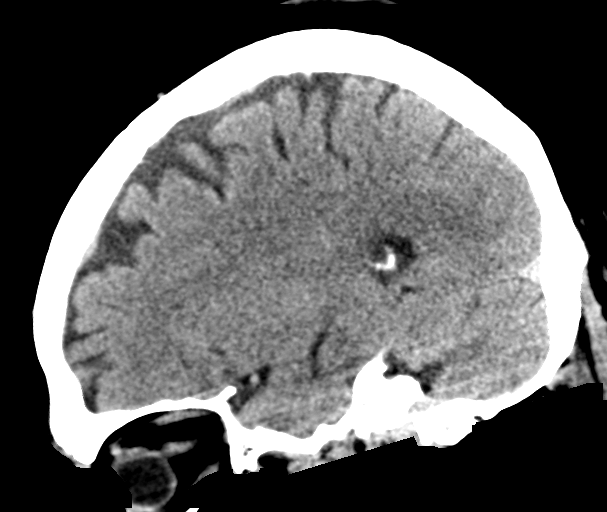

[16 of 47 positions shown; findings below may reference images not displayed]

FINDINGS: Brain: There is no evidence for acute hemorrhage, hydrocephalus,
mass lesion, or abnormal extra-axial fluid collection. No definite
CT evidence for acute infarction. Diffuse loss of parenchymal volume
is consistent with atrophy.

Vascular: No hyperdense vessel or unexpected calcification.

Skull: No evidence for fracture. No worrisome lytic or sclerotic
lesion.

Sinuses/Orbits: The visualized paranasal sinuses and mastoid air
cells are clear. Visualized portions of the globes and intraorbital
fat are unremarkable.

Other: None.
IMPRESSION: 1. No acute intracranial abnormality.
2. Mild atrophy.

## 2024-07-25 ENCOUNTER — Encounter: Payer: Self-pay | Admitting: Gastroenterology

## 2024-08-01 ENCOUNTER — Ambulatory Visit: Admitting: Certified Registered Nurse Anesthetist

## 2024-08-01 ENCOUNTER — Encounter: Payer: Self-pay | Admitting: Gastroenterology

## 2024-08-01 ENCOUNTER — Encounter
Admission: RE | Disposition: A | Discharge status: Home / Self Care | Payer: Self-pay | Source: Home / Self Care | Attending: Gastroenterology

## 2024-08-01 ENCOUNTER — Ambulatory Visit
Admission: RE | Admit: 2024-08-01 | Discharge: 2024-08-01 | Disposition: A | Discharge status: Home / Self Care | Payer: Self-pay | Attending: Gastroenterology | Admitting: Gastroenterology

## 2024-08-01 DIAGNOSIS — Z955 Presence of coronary angioplasty implant and graft: Secondary | ICD-10-CM | POA: Diagnosis not present

## 2024-08-01 DIAGNOSIS — I2510 Atherosclerotic heart disease of native coronary artery without angina pectoris: Secondary | ICD-10-CM | POA: Diagnosis not present

## 2024-08-01 DIAGNOSIS — Z7901 Long term (current) use of anticoagulants: Secondary | ICD-10-CM | POA: Insufficient documentation

## 2024-08-01 DIAGNOSIS — I10 Essential (primary) hypertension: Secondary | ICD-10-CM | POA: Insufficient documentation

## 2024-08-01 DIAGNOSIS — K5730 Diverticulosis of large intestine without perforation or abscess without bleeding: Secondary | ICD-10-CM | POA: Insufficient documentation

## 2024-08-01 DIAGNOSIS — Z87891 Personal history of nicotine dependence: Secondary | ICD-10-CM | POA: Insufficient documentation

## 2024-08-01 DIAGNOSIS — I4891 Unspecified atrial fibrillation: Secondary | ICD-10-CM | POA: Insufficient documentation

## 2024-08-01 DIAGNOSIS — K219 Gastro-esophageal reflux disease without esophagitis: Secondary | ICD-10-CM | POA: Diagnosis present

## 2024-08-01 DIAGNOSIS — Z1211 Encounter for screening for malignant neoplasm of colon: Secondary | ICD-10-CM | POA: Diagnosis not present

## 2024-08-01 DIAGNOSIS — Z13810 Encounter for screening for upper gastrointestinal disorder: Secondary | ICD-10-CM | POA: Insufficient documentation

## 2024-08-01 DIAGNOSIS — Z860101 Personal history of adenomatous and serrated colon polyps: Secondary | ICD-10-CM | POA: Diagnosis present

## 2024-08-01 DIAGNOSIS — I252 Old myocardial infarction: Secondary | ICD-10-CM | POA: Insufficient documentation

## 2024-08-01 DIAGNOSIS — D123 Benign neoplasm of transverse colon: Secondary | ICD-10-CM | POA: Diagnosis not present

## 2024-08-01 DIAGNOSIS — D128 Benign neoplasm of rectum: Secondary | ICD-10-CM | POA: Diagnosis not present

## 2024-08-01 HISTORY — PX: POLYPECTOMY: SHX149

## 2024-08-01 HISTORY — PX: ESOPHAGOGASTRODUODENOSCOPY: SHX5428

## 2024-08-01 HISTORY — PX: COLONOSCOPY: SHX5424

## 2024-08-01 SURGERY — COLONOSCOPY
Anesthesia: General

## 2024-08-01 MED ORDER — PROPOFOL 500 MG/50ML IV EMUL
INTRAVENOUS | Status: DC | PRN
  Administered 2024-08-01: 160 ug/kg/min via INTRAVENOUS

## 2024-08-01 MED ORDER — SODIUM CHLORIDE 0.9 % IV SOLN
INTRAVENOUS | Status: DC
  Administered 2024-08-01: 20 mL/h via INTRAVENOUS

## 2024-08-01 MED ORDER — LIDOCAINE HCL (CARDIAC) PF 100 MG/5ML IV SOSY
PREFILLED_SYRINGE | INTRAVENOUS | Status: DC | PRN
  Administered 2024-08-01: 100 mg via INTRAVENOUS

## 2024-08-01 MED ORDER — PROPOFOL 10 MG/ML IV BOLUS
INTRAVENOUS | Status: DC | PRN
  Administered 2024-08-01: 70 mg via INTRAVENOUS

## 2024-08-01 NOTE — Anesthesia Procedure Notes (Signed)
 Date/Time: 08/01/2024 9:30 AM  Performed by: Duwayne Craven, CRNAPre-anesthesia Checklist: Patient identified, Emergency Drugs available, Suction available, Patient being monitored and Timeout performed Patient Re-evaluated:Patient Re-evaluated prior to induction Oxygen Delivery Method: Nasal cannula Induction Type: IV induction Placement Confirmation: CO2 detector and positive ETCO2

## 2024-08-01 NOTE — H&P (Signed)
 Ruel Kung , MD [address], [place], [place], [address] Phone: [phone] Fax: [phone]  Primary Care Physician:  Cleotilde Oneil FALCON, MD   Pre-Procedure History & Physical: HPI:  Joseph Grant is a 63 y.o. male is here for an endoscopy and colonoscopy    Past Medical History:  Diagnosis Date   AF (atrial fibrillation) (HCC)    Anginal pain (HCC)    Coronary artery disease    GERD (gastroesophageal reflux disease)    Hypertension    Myocardial infarction [facility])     Past Surgical History:  Procedure Laterality Date   COLONOSCOPY WITH PROPOFOL  N/A 04/27/2017   Procedure: COLONOSCOPY WITH PROPOFOL ;  Surgeon: Gaylyn Gladis PENNER, MD;  Location: [facility] ENDOSCOPY;  Service: Endoscopy;  Laterality: N/A;   CORONARY ANGIOPLASTY      Prior to Admission medications   Medication Sig Start Date End Date Taking? Authorizing Provider  hydrochlorothiazide  (HYDRODIURIL ) 12.5 MG tablet Take 12.5 mg by mouth daily.    [provider]  lovastatin (MEVACOR) 20 MG tablet Take 20 mg by mouth at bedtime.    [provider]  omeprazole (PRILOSEC) 20 MG capsule Take 20 mg by mouth daily.    [provider]  rivaroxaban (XARELTO) 20 MG TABS tablet Take 20 mg by mouth daily with supper.    [provider]  sotalol  (BETAPACE ) 80 MG tablet Take 80 mg by mouth 2 (two) times daily.    [provider]    Allergies as of 07/07/2024 - Review Complete 12/16/2020  Allergen Reaction Noted   Clindamycin/lincomycin  04/24/2017   Erythromycin  04/24/2017   Penicillins  04/24/2017    No family history on file.  Social History   Socioeconomic History   Marital status: Married    Spouse name: Not on file   Number of children: Not on file   Years of education: Not on file   Highest education level: Not on file  Occupational History   Not on file  Tobacco Use   Smoking status: Former    Types: Cigarettes   Smokeless tobacco: Former   Tobacco  comments:    3-4 per day  Substance and Sexual Activity   Alcohol use: Yes   Drug use: Never   Sexual activity: Not on file  Other Topics Concern   Not on file  Social History Narrative   Not on file   Social Drivers of Health   Financial Resource Strain: Low Risk  (02/15/2024)   Received from [facility]   Overall Financial Resource Strain (CARDIA)    Difficulty of Paying Living Expenses: Not hard at all  Food Insecurity: No Food Insecurity (02/15/2024)   Received from [facility]   Hunger Vital Sign    Within the past 12 months, you worried that your food would run out before you got the money to buy more.: Never true    Within the past 12 months, the food you bought just didn't last and you didn't have money to get more.: Never true  Transportation Needs: No Transportation Needs (02/15/2024)   Received from [facility] - Transportation    In the past 12 months, has lack of transportation kept you from medical appointments or from getting medications?: No    Lack of Transportation (Non-Medical): No  Physical Activity: Insufficiently Active (03/09/2018)   Received from [facility]   Exercise Vital Sign  Days of Exercise per Week: 3 days    Minutes of Exercise per Session: 30 min  Stress: No Stress Concern Present (03/09/2018)   Received from [facility] - Occupational Stress Questionnaire    Feeling of Stress : Only a little  Social Connections: Moderately Integrated (03/09/2018)   Received from [facility]   Social Connection and Isolation Panel    Frequency of Communication with Friends and Family: More than three times a week    Frequency of Social Gatherings with Friends and Family: More than three times a week    Attends Religious Services: Never    Database administrator or Organizations: Yes    Attends Museum/gallery exhibitions officer: More than 4 times per year    Marital Status: Married  Catering manager Violence: Not on file    Review of Systems: See HPI, otherwise negative ROS  Physical Exam: There were no vitals taken for this visit. General:   Alert,  pleasant and cooperative in NAD Head:  Normocephalic and atraumatic. Neck:  Supple; no masses or thyromegaly. Lungs:  Clear throughout to auscultation, normal respiratory effort.    Heart:  +S1, +S2, Regular rate and rhythm, No edema. Abdomen:  Soft, nontender and nondistended. Normal bowel sounds, without guarding, and without rebound.   Neurologic:  Alert and  oriented x4;  grossly normal neurologically.  Impression/Plan: Joseph Grant is here for an endoscopy and colonoscopy  to be performed for  evaluation of GERD and personal history of colon polyps    Risks, benefits, limitations, and alternatives regarding endoscopy have been reviewed with the patient.  Questions have been answered.  All parties agreeable.   Ruel Kung, MD  08/01/2024, 8:34 AM

## 2024-08-01 NOTE — Op Note (Signed)
 [facility] Gastroenterology Patient Name: Joseph Grant Procedure Date: 08/01/2024 9:19 AM MRN: [id number] Account #: [id number] Date of Birth: 08-21-1961 Admit Type: Outpatient Age: 63 Room: [facility] ENDO ROOM 1 Gender: Male Note Status: Supervisor Override Instrument Name: [facility] GI Scope [id number] Procedure:             Upper GI endoscopy Indications:           Gastro-esophageal reflux disease, Screening for                         Barrett's esophagus Providers:             Ruel Kung MD, MD Medicines:             Monitored Anesthesia Care Complications:         No immediate complications. Procedure:             Pre-Anesthesia Assessment:                        - Prior to the procedure, a History and Physical was                         performed, and patient medications, allergies and                         sensitivities were reviewed. The patient's tolerance                         of previous anesthesia was reviewed.                        - The risks and benefits of the procedure and the                         sedation options and risks were discussed with the                         patient. All questions were answered and informed                         consent was obtained.                        - ASA Grade Assessment: II - A patient with mild                         systemic disease.                        After obtaining informed consent, the endoscope was                         passed under direct vision. Throughout the procedure,                         the patient's blood pressure, pulse, and oxygen                         saturations were monitored continuously. The Endoscope  was introduced through the mouth, and advanced to the                         third part of duodenum. The upper GI endoscopy was                         accomplished with ease. The patient tolerated the                         procedure well. Findings:       The esophagus was normal.      The stomach was normal.      The examined duodenum was normal. Impression:            - Normal esophagus.                        - Normal stomach.                        - Normal examined duodenum.                        - No specimens collected. Recommendation:        - Perform a colonoscopy today. Procedure Code(s):     --- Professional ---                        [phone], Esophagogastroduodenoscopy, flexible,                         transoral; diagnostic, including collection of                         specimen(s) by brushing or washing, when performed                         (separate procedure) Diagnosis Code(s):     --- Professional ---                        Z13.810, Encounter for screening for upper                         gastrointestinal disorder CPT copyright 2022 American Medical Association. All rights reserved. The codes documented in this report are preliminary and upon coder review may  be revised to meet current compliance requirements. Ruel Kung, MD Ruel Kung MD, MD 08/01/2024 9:36:58 AM This report has been signed electronically. Number of Addenda: 0 Note Initiated On: 08/01/2024 9:19 AM Estimated Blood Loss:  Estimated blood loss: none.      [facility]

## 2024-08-01 NOTE — Op Note (Signed)
 [facility] Gastroenterology Patient Name: Joseph Grant Procedure Date: 08/01/2024 9:19 AM MRN: [id number] Account #: [id number] Date of Birth: 15-Feb-1961 Admit Type: Outpatient Age: 63 Room: [facility] ENDO ROOM 1 Gender: Male Note Status: Finalized Instrument Name: Colon Scope [id number] Procedure:             Colonoscopy Indications:           Surveillance: Personal history of adenomatous polyps                         on last colonoscopy 5 years ago, Last colonoscopy: May                         2018 Providers:             Ruel Kung MD, MD Medicines:             Monitored Anesthesia Care Complications:         No immediate complications. Procedure:             Pre-Anesthesia Assessment:                        - Prior to the procedure, a History and Physical was                         performed, and patient medications, allergies and                         sensitivities were reviewed. The patient's tolerance                         of previous anesthesia was reviewed.                        - The risks and benefits of the procedure and the                         sedation options and risks were discussed with the                         patient. All questions were answered and informed                         consent was obtained.                        - ASA Grade Assessment: II - A patient with mild                         systemic disease.                        After obtaining informed consent, the colonoscope was                         passed under direct vision. Throughout the procedure,                         the patient's blood pressure, pulse, and oxygen  saturations were monitored continuously. The                         Colonoscope was introduced through the anus and                         advanced to the the cecum, identified by the                         appendiceal orifice. The colonoscopy was performed                          with ease. The patient tolerated the procedure well.                         The quality of the bowel preparation was excellent.                         The ileocecal valve, appendiceal orifice, and rectum                         were photographed. Findings:      The perianal and digital rectal examinations were normal.      Multiple medium-mouthed diverticula were found in the left colon.      A 5 mm polyp was found in the rectum. The polyp was sessile. The polyp       was removed with a cold snare. Resection and retrieval were complete.      Two sessile polyps were found in the transverse colon. The polyps were 4       to 6 mm in size. These polyps were removed with a cold snare. Resection       and retrieval were complete.      The exam was otherwise without abnormality on direct and retroflexion       views. Impression:            - Diverticulosis in the left colon.                        - One 5 mm polyp in the rectum, removed with a cold                         snare. Resected and retrieved.                        - Two 4 to 6 mm polyps in the transverse colon,                         removed with a cold snare. Resected and retrieved.                        - The examination was otherwise normal on direct and                         retroflexion views. Recommendation:        - Discharge patient to home (with escort).                        - Resume  previous diet.                        - Continue present medications.                        - Await pathology results.                        - Repeat colonoscopy in 3 - 5 years for surveillance                         based on pathology results. Procedure Code(s):     --- Professional ---                        [id number], Colonoscopy, flexible; with removal of                         tumor(s), polyp(s), or other lesion(s) by snare                         technique Diagnosis Code(s):     --- Professional ---                         Z86.010, Personal history of colonic polyps                        D12.8, Benign neoplasm of rectum                        D12.3, Benign neoplasm of transverse colon (hepatic                         flexure or splenic flexure)                        K57.30, Diverticulosis of large intestine without                         perforation or abscess without bleeding CPT copyright 2022 American Medical Association. All rights reserved. The codes documented in this report are preliminary and upon coder review may  be revised to meet current compliance requirements. Ruel Kung, MD Ruel Kung MD, MD 08/01/2024 9:53:46 AM This report has been signed electronically. Number of Addenda: 0 Note Initiated On: 08/01/2024 9:19 AM Scope Withdrawal Time: 0 hours 9 minutes 55 seconds  Total Procedure Duration: 0 hours 12 minutes 30 seconds  Estimated Blood Loss:  Estimated blood loss: none.      [facility]

## 2024-08-01 NOTE — Transfer of Care (Signed)
 Immediate Anesthesia Transfer of Care Note  Patient: Joseph Grant  Procedure(s) Performed: COLONOSCOPY EGD (ESOPHAGOGASTRODUODENOSCOPY) POLYPECTOMY, INTESTINE  Patient Location: PACU  Anesthesia Type:General  Level of Consciousness: awake and alert   Airway & Oxygen Therapy: Patient Spontanous Breathing  Post-op Assessment: Report given to RN and Post -op Vital signs reviewed and stable  Post vital signs: Reviewed and stable  Last Vitals:  Vitals Value Taken Time  BP    Temp    Pulse    Resp    SpO2      Last Pain:  Vitals:   08/01/24 0851  TempSrc: Temporal  PainSc: 0-No pain         Complications: No notable events documented.

## 2024-08-01 NOTE — Anesthesia Preprocedure Evaluation (Signed)
 Anesthesia Evaluation  Patient identified by MRN, date of birth, ID band Patient awake    Reviewed: Allergy & Precautions, H&P , NPO status , Patient's Chart, lab work & pertinent test results, reviewed documented beta blocker date and time   History of Anesthesia Complications Negative for: history of anesthetic complications  Airway Mallampati: I  TM Distance: >3 FB Neck ROM: full    Dental  (+) Dental Advidsory Given, Caps, Teeth Intact, Chipped   Pulmonary neg pulmonary ROS, Patient abstained from smoking., former smoker   Pulmonary exam normal breath sounds clear to auscultation       Cardiovascular Exercise Tolerance: Good hypertension, (-) angina + CAD, + Past MI and + Cardiac Stents  + dysrhythmias Atrial Fibrillation (-) Valvular Problems/Murmurs Rhythm:regular Rate:Normal     Neuro/Psych negative neurological ROS  negative psych ROS   GI/Hepatic Neg liver ROS,GERD  ,,  Endo/Other  negative endocrine ROS    Renal/GU negative Renal ROS  negative genitourinary   Musculoskeletal   Abdominal   Peds  Hematology negative hematology ROS (+)   Anesthesia Other Findings Past Medical History: No date: AF (atrial fibrillation) (HCC) No date: Anginal pain (HCC) No date: Coronary artery disease No date: GERD (gastroesophageal reflux disease) No date: Hypertension No date: Myocardial infarction (HCC)   Reproductive/Obstetrics negative OB ROS                              Anesthesia Physical Anesthesia Plan  ASA: 3  Anesthesia Plan: General   Post-op Pain Management:    Induction: Intravenous  PONV Risk Score and Plan: 2 and Propofol  infusion, TIVA and Treatment may vary due to age or medical condition  Airway Management Planned: Natural Airway and Nasal Cannula  Additional Equipment:   Intra-op Plan:   Post-operative Plan:   Informed Consent: I have reviewed the patients  History and Physical, chart, labs and discussed the procedure including the risks, benefits and alternatives for the proposed anesthesia with the patient or authorized representative who has indicated his/her understanding and acceptance.     Dental Advisory Given  Plan Discussed with: Anesthesiologist, CRNA and Surgeon  Anesthesia Plan Comments:         Anesthesia Quick Evaluation

## 2024-08-01 NOTE — Anesthesia Postprocedure Evaluation (Signed)
 Anesthesia Post Note  Patient: Joseph Grant  Procedure(s) Performed: COLONOSCOPY EGD (ESOPHAGOGASTRODUODENOSCOPY) POLYPECTOMY, INTESTINE  Patient location during evaluation: PACU Anesthesia Type: General Level of consciousness: awake and alert Pain management: pain level controlled Vital Signs Assessment: post-procedure vital signs reviewed and stable Respiratory status: spontaneous breathing, nonlabored ventilation, respiratory function stable and patient connected to nasal cannula oxygen Cardiovascular status: blood pressure returned to baseline and stable Postop Assessment: no apparent nausea or vomiting Anesthetic complications: no   No notable events documented.   Last Vitals:  Vitals:   08/01/24 1005 08/01/24 1016  BP: 128/82 135/84  Pulse: 65 (!) 59  Resp: 18 18  Temp:    SpO2: 96% 100%    Last Pain:  Vitals:   08/01/24 1016  TempSrc:   PainSc: 0-No pain                 Prentice Murphy

## 2024-08-02 LAB — SURGICAL PATHOLOGY
